# Patient Record
Sex: Female | Born: 1950 | Race: White | Hispanic: No | Marital: Single | Smoking: Former smoker
Health system: Southern US, Community
[De-identification: ages and names within clinical notes are randomized; demographics above are authoritative.]

## PROBLEM LIST (undated history)

## (undated) DIAGNOSIS — T7840XA Allergy, unspecified, initial encounter: Secondary | ICD-10-CM

## (undated) DIAGNOSIS — F329 Major depressive disorder, single episode, unspecified: Secondary | ICD-10-CM

## (undated) DIAGNOSIS — K5790 Diverticulosis of intestine, part unspecified, without perforation or abscess without bleeding: Secondary | ICD-10-CM

## (undated) DIAGNOSIS — M810 Age-related osteoporosis without current pathological fracture: Secondary | ICD-10-CM

## (undated) DIAGNOSIS — I10 Essential (primary) hypertension: Secondary | ICD-10-CM

## (undated) DIAGNOSIS — E785 Hyperlipidemia, unspecified: Secondary | ICD-10-CM

## (undated) DIAGNOSIS — M8580 Other specified disorders of bone density and structure, unspecified site: Secondary | ICD-10-CM

## (undated) DIAGNOSIS — I2510 Atherosclerotic heart disease of native coronary artery without angina pectoris: Principal | ICD-10-CM

## (undated) DIAGNOSIS — C4499 Other specified malignant neoplasm of skin, unspecified: Secondary | ICD-10-CM

## (undated) DIAGNOSIS — H3530 Unspecified macular degeneration: Secondary | ICD-10-CM

## (undated) DIAGNOSIS — F32A Depression, unspecified: Secondary | ICD-10-CM

## (undated) DIAGNOSIS — R0609 Other forms of dyspnea: Secondary | ICD-10-CM

## (undated) DIAGNOSIS — Z86010 Personal history of colonic polyps: Secondary | ICD-10-CM

## (undated) HISTORY — DX: Major depressive disorder, single episode, unspecified: F32.9

## (undated) HISTORY — DX: Other forms of dyspnea: R06.09

## (undated) HISTORY — DX: Other specified disorders of bone density and structure, unspecified site: M85.80

## (undated) HISTORY — DX: Personal history of colonic polyps: Z86.010

## (undated) HISTORY — PX: EYE SURGERY: SHX253

## (undated) HISTORY — DX: Age-related osteoporosis without current pathological fracture: M81.0

## (undated) HISTORY — DX: Allergy, unspecified, initial encounter: T78.40XA

## (undated) HISTORY — DX: Atherosclerotic heart disease of native coronary artery without angina pectoris: I25.10

## (undated) HISTORY — DX: Diverticulosis of intestine, part unspecified, without perforation or abscess without bleeding: K57.90

## (undated) HISTORY — DX: Unspecified macular degeneration: H35.30

## (undated) HISTORY — DX: Depression, unspecified: F32.A

## (undated) HISTORY — DX: Essential (primary) hypertension: I10

## (undated) HISTORY — DX: Hyperlipidemia, unspecified: E78.5

## (undated) HISTORY — DX: Other specified malignant neoplasm of skin, unspecified: C44.99

---

## 1999-05-05 ENCOUNTER — Other Ambulatory Visit: Admission: RE | Admit: 1999-05-05 | Discharge: 1999-05-05 | Payer: Self-pay | Admitting: Gastroenterology

## 2000-10-10 ENCOUNTER — Encounter: Admission: RE | Admit: 2000-10-10 | Discharge: 2000-10-10 | Payer: Self-pay | Admitting: *Deleted

## 2000-10-10 ENCOUNTER — Encounter: Payer: Self-pay | Admitting: *Deleted

## 2002-07-11 HISTORY — PX: OTHER SURGICAL HISTORY: SHX169

## 2003-09-19 ENCOUNTER — Encounter: Admission: RE | Admit: 2003-09-19 | Discharge: 2003-09-19 | Payer: Self-pay | Admitting: Family Medicine

## 2003-12-25 ENCOUNTER — Other Ambulatory Visit: Admission: RE | Admit: 2003-12-25 | Discharge: 2003-12-25 | Payer: Self-pay | Admitting: Family Medicine

## 2005-01-08 HISTORY — PX: ROTATOR CUFF REPAIR: SHX139

## 2007-06-22 ENCOUNTER — Inpatient Hospital Stay (HOSPITAL_COMMUNITY): Admission: EM | Admit: 2007-06-22 | Discharge: 2007-06-25 | Payer: Self-pay | Admitting: Emergency Medicine

## 2010-08-30 LAB — LIPID PANEL: HDL: 69 mg/dL (ref 35–70)

## 2010-11-09 HISTORY — PX: COLONOSCOPY W/ POLYPECTOMY: SHX1380

## 2010-11-22 LAB — BASIC METABOLIC PANEL
Creatinine: 0.8 mg/dL (ref 0.5–1.1)
Glucose: 83 mg/dL
Potassium: 4.5 mmol/L (ref 3.4–5.3)

## 2010-11-22 LAB — HEPATIC FUNCTION PANEL
ALT: 13 U/L (ref 7–35)
AST: 22 U/L (ref 13–35)

## 2010-11-22 LAB — CBC AND DIFFERENTIAL: Hemoglobin: 13.8 g/dL (ref 12.0–16.0)

## 2010-11-22 LAB — HM COLONOSCOPY: HM Colonoscopy: 2009

## 2010-11-22 LAB — HM MAMMOGRAPHY: HM Mammogram: 2005

## 2010-11-23 NOTE — H&P (Signed)
Candice Saunders, Candice Saunders             ACCOUNT NO.:  [id number]   MEDICAL RECORD NO.:  [id number]          PATIENT TYPE:  INP   LOCATION:  1830                         FACILITY:  [facility]   PHYSICIAN:  Shirley Friar, MDDATE OF BIRTH:  1950-09-13   DATE OF ADMISSION:  06/22/2007  DATE OF DISCHARGE:                              HISTORY & PHYSICAL   We are admitting Ms. Griffing today with the admission diagnosis of lower  GI bleed to the gastroenterology service, to attending, Dr. Bernette Redbird.   HISTORY OF PRESENT ILLNESS:  This is a 59 year old female who underwent  colonoscopy on June 20, 2007.  She had three polyps removed, one  subcentimeter polyp in the rectum, one 3 mm polyp in the ascending colon  and one 18 mm polyp in the proximal ascending colon right near the  ileocecal valve.  The 1.8 cm polyp was removed with snare cautery in  piecemeal fashion, while the smaller polyps were removed without  cautery.  After her procedure she felt fine until this afternoon at  approximately 1:00 p.m. when she says she began to feel faint and had  cramps as though she was going to have diarrhea.  When she had her bowel  movement, there was a large amount of bright red blood with some stool  mixed in.  The patient reports that she felt lightheaded and felt like  passing out, however, now she feels better.  She denies shortness of  breath, palpitations, any recent fever or other illness.   PRIMARY CARE PHYSICIAN:  Dr. Nadyne Coombes.   ALLERGIES:  She denies any medical allergies.   MEDICATIONS:  Her only medication is Fosamax , which she started one  week ago and has only taken one dose.   PAST MEDICAL HISTORY:  Significant for  1. Rectal polyps in October 2000 which was removed by her GI      physician, Dr. Bernette Redbird.  2. Osteoporosis.  3. She had a rotator cuff repair.  4. She has a history of high cholesterol which she manages with diet.   SOCIAL HISTORY:  Positive for  smoking a pack a day, drinking alcohol,  two beers a day.  She lives at home with her partner.   FAMILY HISTORY:  Significant for no colon cancer or polyps.   REVIEW OF SYSTEMS:  As per HPI.   PHYSICAL EXAMINATION:  GENERAL APPEARANCE:  She is alert and oriented in  no apparent distress.  VITAL SIGNS:  Her pulse is currently 78.  Her blood pressure is 128/76  and she is afebrile.  HEART:  Regular rate and rhythm.  LUNGS: Clear to auscultation.  ABDOMEN:  Soft, nontender, nondistended with active bowel sounds.  EXTREMITIES:  She has no edema in her lower extremities.  Her color  looks good.  She actually looks pretty well.   LABORATORY DATA:  Her labs are pending.   ASSESSMENT:  Dr. Charlott Rakes is admitting the patient for lower  gastrointestinal bleed, likely secondary to her polypectomy done on  Wednesday.   PLAN:  Will admit for close monitoring and rehydration, no  immediate  plans for colonoscopy; rather, we will watch the patient closely  overnight and give her MiraLax to help clean out her GI tract.      Stephani Police, [place]      Shirley Friar, MD  Electronically Signed    MLY/MEDQ  D:  06/22/2007  T:  06/24/2007  Job:  [id number]   cc:   Clydie Braun L. Hal Hope, M.D.

## 2010-11-23 NOTE — Discharge Summary (Signed)
Candice Saunders, Candice Saunders             ACCOUNT NO.:  [id number]   MEDICAL RECORD NO.:  [id number]          PATIENT TYPE:  INP   LOCATION:  5528                         FACILITY:  [facility]   PHYSICIAN:  Shirley Friar, MDDATE OF BIRTH:  02/05/1951   DATE OF ADMISSION:  06/22/2007  DATE OF DISCHARGE:  06/25/2007                               DISCHARGE SUMMARY   DISCHARGE DIAGNOSES:  1. Post-polypectomy bleed.  2. Colon polyps removed on June 20, 2007.   HOSPITAL COURSE:  Candice Saunders is a 60 year old pleasant white female who  came in 2 days after having a colonoscopy,  which she had 3 polyps  removed.  The largest polyp was 1.8 cm, and was removed in the proximal  ascending colon in the piecemeal fashion with snare cautery.  The other  2  polyps were less than 5 mm in size; one was removed with cold forceps  and the other was removed with cold snare.  She also had some diffuse  diverticulosis noted in the sigmoid colon.   On admission her labs revealed a hemoglobin normal at 13.1.  She dropped  down to 10.5 during her hospitalization, but did not require any blood  products.  Her bleeding initially was bright red blood at home, and  decreased in amount and brightness during her hospitalization.  On  June 24, 2007 she had a small amount of dark red colored blood  without any stool.   The plan as of now is to watch her overnight, and if everything  continues to be stable and bleeding continues to subside, then we will  plan for discharge June 25, 2007.   DISCHARGE DIET:  High fiber.   DISCHARGE ACTIVITY:  Increase activity slowly.   DISCHARGE FOLLOWUP:  1. Follow with Dr. Matthias Hughs this week for lab check and office visit.  2. Follow-up appointment with Dr. Hal Hope in 2-3 weeks.   DISCHARGE INSTRUCTIONS:  Avoid ibuprofen products for 14 days.      Shirley Friar, MD  Electronically Signed     VCS/MEDQ  D:  06/24/2007  T:  06/25/2007  Job:  [id number]   cc:    Bernette Redbird, M.D.  Marcos Eke. Hal Hope, M.D.

## 2011-04-15 LAB — CBC
HCT: 31.5 — ABNORMAL LOW
Hemoglobin: 10.9 — ABNORMAL LOW
MCHC: 34.6
MCV: 95.5
Platelets: 303
RBC: 3.3 — ABNORMAL LOW
RDW: 12.5
WBC: 4.8

## 2011-04-18 LAB — CBC
HCT: 29.8 — ABNORMAL LOW
HCT: 30.7 — ABNORMAL LOW
HCT: 38.3
Hemoglobin: 10.5 — ABNORMAL LOW
Hemoglobin: 10.5 — ABNORMAL LOW
Hemoglobin: 10.5 — ABNORMAL LOW
Hemoglobin: 13.1
MCHC: 34.1
MCHC: 34.2
MCHC: 34.2
MCHC: 35.3
MCV: 94.1
MCV: 95.5
MCV: 95.8
MCV: 96.4
Platelets: 252
Platelets: 258
Platelets: 269
Platelets: 313
RBC: 3.16 — ABNORMAL LOW
RBC: 3.17 — ABNORMAL LOW
RBC: 3.18 — ABNORMAL LOW
RBC: 4.02
RDW: 12.3
RDW: 12.4
RDW: 12.5
RDW: 12.6
WBC: 4.7
WBC: 5.5
WBC: 8.6

## 2011-04-18 LAB — COMPREHENSIVE METABOLIC PANEL
ALT: 16
AST: 22
Albumin: 4.1
Alkaline Phosphatase: 67
BUN: 9
CO2: 27
Calcium: 9.1
Chloride: 100
Creatinine, Ser: 0.86
GFR calc Af Amer: >60
GFR calc non Af Amer: >60
Glucose, Bld: 115 — ABNORMAL HIGH
Potassium: 3.7
Sodium: 134 — ABNORMAL LOW
Total Bilirubin: 0.7
Total Protein: 6.8

## 2011-09-13 ENCOUNTER — Other Ambulatory Visit: Payer: Self-pay | Admitting: Family Medicine

## 2011-09-13 MED ORDER — CITALOPRAM HYDROBROMIDE 20 MG PO TABS: 20.0000 mg | ORAL_TABLET | Freq: Every day | ORAL | Status: DC

## 2011-09-29 ENCOUNTER — Encounter: Payer: Self-pay | Admitting: Family Medicine

## 2011-09-29 DIAGNOSIS — M8580 Other specified disorders of bone density and structure, unspecified site: Secondary | ICD-10-CM

## 2011-09-29 DIAGNOSIS — Z860100 Personal history of colon polyps, unspecified: Secondary | ICD-10-CM

## 2011-09-29 DIAGNOSIS — J302 Other seasonal allergic rhinitis: Secondary | ICD-10-CM

## 2011-09-29 DIAGNOSIS — F32A Depression, unspecified: Secondary | ICD-10-CM

## 2011-09-29 DIAGNOSIS — F329 Major depressive disorder, single episode, unspecified: Secondary | ICD-10-CM

## 2011-09-29 DIAGNOSIS — E785 Hyperlipidemia, unspecified: Secondary | ICD-10-CM

## 2011-09-29 DIAGNOSIS — M810 Age-related osteoporosis without current pathological fracture: Secondary | ICD-10-CM | POA: Insufficient documentation

## 2011-09-29 HISTORY — DX: Other specified disorders of bone density and structure, unspecified site: M85.80

## 2011-09-29 HISTORY — DX: Personal history of colonic polyps: Z86.010

## 2011-09-29 HISTORY — DX: Personal history of colon polyps, unspecified: Z86.0100

## 2011-10-03 ENCOUNTER — Encounter: Payer: Self-pay | Admitting: Family Medicine

## 2011-10-03 ENCOUNTER — Ambulatory Visit (INDEPENDENT_AMBULATORY_CARE_PROVIDER_SITE_OTHER): Payer: BC Managed Care – PPO | Admitting: Family Medicine

## 2011-10-03 VITALS — BP 120/66 | HR 68 | Temp 96.7°F | Resp 16 | Ht 62.5 in | Wt 146.8 lb

## 2011-10-03 DIAGNOSIS — F329 Major depressive disorder, single episode, unspecified: Secondary | ICD-10-CM

## 2011-10-03 DIAGNOSIS — Z0000 Encounter for general adult medical examination without abnormal findings: Secondary | ICD-10-CM

## 2011-10-03 DIAGNOSIS — F32A Depression, unspecified: Secondary | ICD-10-CM

## 2011-10-03 DIAGNOSIS — M8580 Other specified disorders of bone density and structure, unspecified site: Secondary | ICD-10-CM

## 2011-10-03 DIAGNOSIS — Z9109 Other allergy status, other than to drugs and biological substances: Secondary | ICD-10-CM

## 2011-10-03 LAB — CBC WITH DIFFERENTIAL/PLATELET
Basophils Absolute: 0 10*3/uL (ref 0.0–0.1)
Basophils Relative: 0 % (ref 0–1)
Eosinophils Absolute: 0.1 10*3/uL (ref 0.0–0.7)
Eosinophils Relative: 1 % (ref 0–5)
Lymphocytes Relative: 20 % (ref 12–46)
MCV: 96.9 fL (ref 78.0–100.0)
Platelets: 317 10*3/uL (ref 150–400)
RDW: 12.6 % (ref 11.5–15.5)
WBC: 8.3 10*3/uL (ref 4.0–10.5)

## 2011-10-03 LAB — COMPREHENSIVE METABOLIC PANEL
ALT: 18 U/L (ref 0–35)
AST: 25 U/L (ref 0–37)
Alkaline Phosphatase: 63 U/L (ref 39–117)
Creat: 0.75 mg/dL (ref 0.50–1.10)
Total Bilirubin: 0.6 mg/dL (ref 0.3–1.2)

## 2011-10-03 LAB — LIPID PANEL
Total CHOL/HDL Ratio: 2.4 Ratio
VLDL: 9 mg/dL (ref 0–40)

## 2011-10-03 LAB — TSH: TSH: 0.913 u[IU]/mL (ref 0.350–4.500)

## 2011-10-03 MED ORDER — CITALOPRAM HYDROBROMIDE 20 MG PO TABS: 10.0000 mg | ORAL_TABLET | Freq: Every day | ORAL | Status: DC

## 2011-10-03 MED ORDER — DICLOFENAC SODIUM 75 MG PO TBEC: 75.0000 mg | DELAYED_RELEASE_TABLET | Freq: Two times a day (BID) | ORAL | Status: DC | PRN

## 2011-10-03 MED ORDER — ALENDRONATE SODIUM 70 MG PO TABS: 70.0000 mg | ORAL_TABLET | ORAL | Status: DC

## 2011-10-03 MED ORDER — FLUTICASONE PROPIONATE 50 MCG/ACT NA SUSP: 2.0000 | NASAL | Status: DC | PRN

## 2011-10-03 NOTE — Progress Notes (Signed)
  Subjective:    Patient ID: Candice Saunders, female    DOB: 1951/04/18, 61 y.o.   MRN: [id number]  HPI Presents for CPE  Recent herpes Zoster in October.  Rash lasted 6 weeks however pain resolved in less than a week. Has received in the past Zostervax.  Arthralgias interested in NSAID refill; Denies GI side effects  Depression- symptoms in remission interested in weaning from Celexa.  Weight loss- exercising/yard work; [employer]  Allergies with occasional ETD Review of Systems     Objective:   Physical Exam  Constitutional: She is oriented to person, place, and time. She appears well-developed.  HENT:  Head: Normocephalic.  Right Ear: External ear normal. Tympanic membrane is retracted.  Left Ear: External ear normal. Tympanic membrane is retracted.  Nose: Mucosal edema present.  Mouth/Throat: Oropharynx is clear and moist.  Eyes: Conjunctivae and EOM are normal. Pupils are equal, round, and reactive to light.  Neck: Normal range of motion. Neck supple. No thyromegaly present.  Cardiovascular: Normal rate, regular rhythm and normal heart sounds.   Pulmonary/Chest: Effort normal and breath sounds normal. Right breast exhibits no mass, no nipple discharge and no skin change. Left breast exhibits no mass, no nipple discharge and no skin change.  Abdominal: Soft. Bowel sounds are normal. There is no hepatosplenomegaly. There is no tenderness.  Musculoskeletal: Normal range of motion.  Neurological: She is alert and oriented to person, place, and time. She has normal reflexes. She exhibits normal muscle tone. Coordination normal.  Skin:       Multiple lentigo  Psychiatric: She has a normal mood and affect.       Assessment & Plan:   1. Routine general medical examination at a health care facility  Comprehensive metabolic panel, Lipid panel  2. Osteopenia  diclofenac (VOLTAREN) 75 MG EC tablet, alendronate (FOSAMAX) 70 MG tablet  3. Depression  citalopram (CELEXA) 20  MG tablet, CBC with Differential, TSH  4. Environmental allergies  fluticasone (FLONASE) 50 MCG/ACT nasal spray   Patient given instructions to wean from Celexa over the next 4 weeks. Anticipatory guidance

## 2011-10-13 ENCOUNTER — Other Ambulatory Visit: Payer: Self-pay | Admitting: Family Medicine

## 2011-10-13 DIAGNOSIS — E871 Hypo-osmolality and hyponatremia: Secondary | ICD-10-CM

## 2011-10-27 ENCOUNTER — Other Ambulatory Visit (INDEPENDENT_AMBULATORY_CARE_PROVIDER_SITE_OTHER): Payer: BC Managed Care – PPO | Admitting: *Deleted

## 2011-10-27 DIAGNOSIS — E871 Hypo-osmolality and hyponatremia: Secondary | ICD-10-CM

## 2011-10-27 LAB — BASIC METABOLIC PANEL
Calcium: 9 mg/dL (ref 8.4–10.5)
Creat: 0.71 mg/dL (ref 0.50–1.10)
Sodium: 132 mEq/L — ABNORMAL LOW (ref 135–145)

## 2011-11-23 ENCOUNTER — Telehealth: Payer: Self-pay | Admitting: Family Medicine

## 2011-11-23 ENCOUNTER — Other Ambulatory Visit: Payer: Self-pay | Admitting: Family Medicine

## 2011-11-23 DIAGNOSIS — E871 Hypo-osmolality and hyponatremia: Secondary | ICD-10-CM

## 2011-11-23 NOTE — Telephone Encounter (Signed)
LM on VM.  Would like to recheck CMET in 2-3 weeks.

## 2011-12-02 ENCOUNTER — Telehealth: Payer: Self-pay

## 2011-12-02 MED ORDER — CITALOPRAM HYDROBROMIDE 10 MG PO TABS: 10.0000 mg | ORAL_TABLET | Freq: Every day | ORAL | Status: DC

## 2011-12-02 NOTE — Telephone Encounter (Signed)
Pharmacy called and said that pt reports that her doctor has increased her citalopram to 20 mg one tablet QD instead of 0.5 tab QD. If so, they need a new Rx sent in to them. Dr Hal Hope, did you want this increase?

## 2011-12-02 NOTE — Telephone Encounter (Signed)
If patient was inadvertently taking Celexa 20 mg daily may continue if her mood symptoms improved on that dose(can refill for 1 year) otherwise we can refill 1 year's worth of Celexa at prior dose.  Please let patient know her sodium is slightly low and can occur with either Voltaren or Celexa.  No need to change medications at this time or repeat labs.

## 2011-12-02 NOTE — Telephone Encounter (Signed)
Spoke w/pt and she said she had mistakenly been taking a whole tablet of the 20 mg. She stated that she would rather go back on the 10 mg and take a whole tablet instead of breaking the 20 mg in half. Sending in new Rx per Dr Lenord Fellers note to pharmacy. Called pharm to notify of change and that pt is on her way to pharm.

## 2011-12-13 ENCOUNTER — Telehealth: Payer: Self-pay

## 2011-12-13 NOTE — Telephone Encounter (Signed)
Pt would like lab results taken in April by Dr Hal Hope.  Please call her back.

## 2011-12-14 NOTE — Telephone Encounter (Signed)
Called pt and she had already gotten results from 4/18. She stated she did come back in after Dr Richter's message left on 5/15 to repeat test and would like results of that test. Told pt that I do not have those results, it is still showing as a future order, but that I will check w/the Lab and have them check on it.

## 2011-12-14 NOTE — Telephone Encounter (Signed)
Pt notified of April labs. Did not know she was supposed to come back for another recheck.. Pt can not afford to and does not want to til Dr. Katrinka Blazing comes in Aug. Since Dr. Hal Hope is leaving.

## 2012-10-09 ENCOUNTER — Encounter (INDEPENDENT_AMBULATORY_CARE_PROVIDER_SITE_OTHER): Payer: BC Managed Care – PPO | Admitting: Ophthalmology

## 2012-10-09 DIAGNOSIS — H3530 Unspecified macular degeneration: Secondary | ICD-10-CM

## 2012-10-09 DIAGNOSIS — H35379 Puckering of macula, unspecified eye: Secondary | ICD-10-CM

## 2012-10-09 DIAGNOSIS — H43819 Vitreous degeneration, unspecified eye: Secondary | ICD-10-CM

## 2012-10-09 HISTORY — DX: Unspecified macular degeneration: H35.30

## 2012-10-15 ENCOUNTER — Encounter: Payer: Self-pay | Admitting: Family Medicine

## 2012-10-15 ENCOUNTER — Ambulatory Visit (INDEPENDENT_AMBULATORY_CARE_PROVIDER_SITE_OTHER): Payer: BC Managed Care – PPO | Admitting: Family Medicine

## 2012-10-15 VITALS — BP 130/82 | HR 61 | Temp 98.2°F | Resp 16 | Ht 63.0 in | Wt 153.6 lb

## 2012-10-15 DIAGNOSIS — Z0000 Encounter for general adult medical examination without abnormal findings: Secondary | ICD-10-CM

## 2012-10-15 DIAGNOSIS — F329 Major depressive disorder, single episode, unspecified: Secondary | ICD-10-CM

## 2012-10-15 DIAGNOSIS — M8580 Other specified disorders of bone density and structure, unspecified site: Secondary | ICD-10-CM

## 2012-10-15 DIAGNOSIS — Z86010 Personal history of colonic polyps: Secondary | ICD-10-CM

## 2012-10-15 DIAGNOSIS — F32A Depression, unspecified: Secondary | ICD-10-CM

## 2012-10-15 DIAGNOSIS — Z23 Encounter for immunization: Secondary | ICD-10-CM

## 2012-10-15 DIAGNOSIS — Z01419 Encounter for gynecological examination (general) (routine) without abnormal findings: Secondary | ICD-10-CM | POA: Insufficient documentation

## 2012-10-15 LAB — POCT URINALYSIS DIPSTICK
Blood, UA: NEGATIVE
Ketones, UA: NEGATIVE
Protein, UA: NEGATIVE
Spec Grav, UA: 1.015
Urobilinogen, UA: 0.2
pH, UA: 8.5

## 2012-10-15 LAB — CBC WITH DIFFERENTIAL/PLATELET
Basophils Absolute: 0 10*3/uL (ref 0.0–0.1)
Eosinophils Relative: 1 % (ref 0–5)
HCT: 41.2 % (ref 36.0–46.0)
Lymphocytes Relative: 27 % (ref 12–46)
Lymphs Abs: 1.4 10*3/uL (ref 0.7–4.0)
MCV: 92.4 fL (ref 78.0–100.0)
Neutro Abs: 3.2 10*3/uL (ref 1.7–7.7)
Platelets: 307 10*3/uL (ref 150–400)
RBC: 4.46 MIL/uL (ref 3.87–5.11)
WBC: 5.2 10*3/uL (ref 4.0–10.5)

## 2012-10-15 LAB — COMPREHENSIVE METABOLIC PANEL
ALT: 15 U/L (ref 0–35)
AST: 21 U/L (ref 0–37)
Albumin: 4.5 g/dL (ref 3.5–5.2)
CO2: 27 mEq/L (ref 19–32)
Calcium: 9.2 mg/dL (ref 8.4–10.5)
Chloride: 96 mEq/L (ref 96–112)
Creat: 0.81 mg/dL (ref 0.50–1.10)
Potassium: 4 mEq/L (ref 3.5–5.3)
Total Protein: 6.8 g/dL (ref 6.0–8.3)

## 2012-10-15 LAB — FOLATE: Folate: >20 ng/mL

## 2012-10-15 LAB — HEMOGLOBIN A1C: Hgb A1c MFr Bld: 5.6 % (ref <5.7)

## 2012-10-15 LAB — LIPID PANEL
Cholesterol: 277 mg/dL — ABNORMAL HIGH (ref 0–200)
VLDL: 11 mg/dL (ref 0–40)

## 2012-10-15 LAB — TSH: TSH: 1.592 u[IU]/mL (ref 0.350–4.500)

## 2012-10-15 LAB — VITAMIN B12: Vitamin B-12: 644 pg/mL (ref 211–911)

## 2012-10-15 MED ORDER — CITALOPRAM HYDROBROMIDE 10 MG PO TABS: 10.0000 mg | ORAL_TABLET | Freq: Every day | ORAL | Status: DC

## 2012-10-15 NOTE — Patient Instructions (Addendum)
Routine general medical examination at a health care facility - Plan: Vitamin B12, Vitamin D 25 hydroxy, TSH, Lipid panel, Folate, CBC with Differential, Comprehensive metabolic panel, Hemoglobin A1c, POCT urinalysis dipstick, CANCELED: CK  Routine gynecological examination - Plan: MM Digital Screening  Depression - Plan: citalopram (CELEXA) 10 MG tablet    RECOMMEND 3 SERVINGS OF DAIRY DAILY OR CALCIUM 1200MG  DAILY WITH VITAMIN D 800 UNITS DAILY. RETURN IN ONE YEAR FOR COMPLETE PHYSICAL EXAM. WE WILL SCHEDULE YOUR MAMMOGRAM.

## 2012-10-15 NOTE — Progress Notes (Signed)
[address]   [place], [place]  [address]   [phone]  Subjective:    Patient ID: Candice Saunders, female    DOB: 09-21-1950, 62 y.o.   MRN: [id number]  HPI This 62 y.o. female presents for evaluation for CPE.  Last physical 10/03/11 by Dr. Hal Hope. Colonoscopy 11/22/10. Precancerous colon polyp with GI bleeding Buccini.   Colonoscopies every two to three years.   Pap smear several years 2008 WNL. Mammogram 2013 at [facility]. Bone density scan 06/2011 [facility]. Tetanus 2004. Influenza vaccine yearly. Zostavax 10/2010. Pneumovax never. Dental exam UTD; every six months. Eye exam 2014.  Depression: stable; decided to continue Celexa 10mg  daily after last CPE.  Broke up with partner two years ago; has been living together for past two years; now deciding to move into town[place].  Insomnia three days per week.   Osteopenia:  Maintained on Fosamax for years; bone density UTD.  Agreeable to trial off of medication.   Review of Systems  Constitutional: Negative for fever, chills, diaphoresis, activity change, appetite change, fatigue and unexpected weight change.  HENT: Negative for hearing loss, ear pain, nosebleeds, congestion, sore throat, facial swelling, rhinorrhea, sneezing, drooling, mouth sores, trouble swallowing, neck pain, neck stiffness, dental problem, voice change, postnasal drip, sinus pressure, tinnitus and ear discharge.   Eyes: Negative for photophobia, pain, discharge, redness, itching and visual disturbance.  Respiratory: Negative for apnea, cough, choking, chest tightness, shortness of breath, wheezing and stridor.   Cardiovascular: Negative for chest pain, palpitations and leg swelling.  Gastrointestinal: Negative for nausea, vomiting, abdominal pain, diarrhea, constipation, blood in stool, abdominal distention, anal bleeding and rectal pain.  Endocrine: Negative for cold intolerance, heat intolerance,  polydipsia, polyphagia and polyuria.  Genitourinary: Negative for dysuria, urgency, frequency, hematuria, flank pain, vaginal bleeding, vaginal discharge, enuresis, difficulty urinating, genital sores, vaginal pain, pelvic pain and dyspareunia.  Musculoskeletal: Negative for myalgias, back pain, joint swelling, arthralgias and gait problem.  Skin: Negative for color change, pallor, rash and wound.  Allergic/Immunologic: Negative for environmental allergies, food allergies and immunocompromised state.  Neurological: Negative for dizziness, tremors, seizures, syncope, facial asymmetry, speech difficulty, weakness, light-headedness, numbness and headaches.  Hematological: Negative for adenopathy. Does not bruise/bleed easily.  Psychiatric/Behavioral: Positive for sleep disturbance. Negative for suicidal ideas, hallucinations, behavioral problems, confusion, self-injury, dysphoric mood, decreased concentration and agitation. The patient is not nervous/anxious and is not hyperactive.         Past Medical History  Diagnosis Date  . Osteopenia 09/29/2011  . Hx of colonic polyps 09/29/2011  . Allergy   . Depression   . Diverticulosis   . Hyperlipidemia   . Macular degeneration 10/09/2012    [facility]/Retinal specialist    Past Surgical History  Procedure Laterality Date  . Rotator cuff repair  01/08/2005    Right  . Eye surgery      Cataract resection  . Colonoscopy w/ polypectomy  11/09/2010    Buccini; every 2-3 years  . Admission  07/11/2002    GI bleeding due to polyp.      Prior to Admission medications   Medication Sig Start Date End Date Taking? Authorizing Provider  alendronate (FOSAMAX) 70 MG tablet Take 1 tablet (70 mg total) by mouth every 7 (seven) days. Take with a full glass of water on an empty stomach. 10/03/11  Yes Dois Davenport, MD  citalopram (CELEXA) 10 MG tablet Take 1 tablet (10 mg total)  by mouth daily. 10/15/12 10/15/13 Yes Ethelda Chick, MD  diclofenac (VOLTAREN) 75  MG EC tablet Take 1 tablet (75 mg total) by mouth 2 (two) times daily as needed. 10/03/11  Yes Dois Davenport, MD  fluticasone (FLONASE) 50 MCG/ACT nasal spray Place 2 sprays into the nose as needed. 10/03/11   Dois Davenport, MD    No Known Allergies  History   Social History  . Marital Status: Single    Spouse Name: N/A    Number of Children: 0  . Years of Education: N/A   Occupational History  . retired     Social research officer, government; retired in 2004.   Social History Main Topics  . Smoking status: Former Smoker -- 30 years    Types: Cigarettes    Quit date: 08/26/2006  . Smokeless tobacco: Never Used  . Alcohol Use: 10.8 oz/week    18 Cans of beer per week     Comment: drinks 2 beers daily; 4 beers on Friday and Saturdays.  . Drug Use: No  . Sexually Active: Not Currently -- Female partner(s)     Comment: Same sex sexual partners   Other Topics Concern  . Not on file   Social History Narrative   Marital status: single; dating seriously.  Dates same sexual partners.      Children: none     Lives: with ex-partner since 2012; moving out in 10/2012.      Employment: retired since 2004; school psychologist x 30 years.        Tobacco: quit smoking 2004.       Alcohol:  2 beers per day; more on weekends (4 beers per weekend night).      Drugs: none      Seatbelt: 100%      Sunscreen:  Sporadic.      Guns: none.      Sexual activity: same sex partners.                Family History  Problem Relation Age of Onset  . Alzheimer's disease Mother   . Cancer Mother 57    Breast cancer  . Osteoporosis Mother   . Stroke Mother   . Heart disease Father 49    AMI, CHF  . Kidney disease Father 38    ESRD/HD  . Stroke Brother     Objective:   Physical Exam  Nursing note and vitals reviewed. Constitutional: She is oriented to person, place, and time. She appears well-developed and well-nourished. No distress.  HENT:  Head: Normocephalic and atraumatic.  Right Ear:  External ear normal.  Left Ear: External ear normal.  Nose: Nose normal.  Mouth/Throat: Oropharynx is clear and moist.  Eyes: Conjunctivae and EOM are normal. Pupils are equal, round, and reactive to light.  Neck: Normal range of motion. Neck supple. No JVD present. No thyromegaly present.  Cardiovascular: Normal rate, regular rhythm, normal heart sounds and intact distal pulses.  Exam reveals no gallop and no friction rub.   No murmur heard. Pulmonary/Chest: Effort normal and breath sounds normal. She has no wheezes. She has no rales.  Abdominal: Soft. Bowel sounds are normal. She exhibits no distension and no mass. There is no tenderness. There is no rebound and no guarding. Hernia confirmed negative in the right inguinal area and confirmed negative in the left inguinal area.  Genitourinary: Vagina normal and uterus normal. No breast swelling, tenderness, discharge or bleeding. There is no rash, tenderness or lesion on the  right labia. There is no rash, tenderness or lesion on the left labia. Cervix exhibits no motion tenderness. Right adnexum displays no mass, no tenderness and no fullness. Left adnexum displays no mass, no tenderness and no fullness.  Musculoskeletal:       Right shoulder: Normal.       Left shoulder: Normal.       Cervical back: Normal.       Thoracic back: Normal.  Lymphadenopathy:    She has no cervical adenopathy.       Right: No inguinal adenopathy present.       Left: No inguinal adenopathy present.  Neurological: She is alert and oriented to person, place, and time. She has normal reflexes. No cranial nerve deficit. She exhibits normal muscle tone. Coordination normal.  Skin: Skin is warm and dry. No rash noted. She is not diaphoretic. No erythema.  Psychiatric: She has a normal mood and affect. Her behavior is normal. Judgment and thought content normal.    EKG:  Sinus bradycardia; rate 59.  TDAP ADMINISTERED BY WANDA.  Results for orders placed in visit on  10/15/12  POCT URINALYSIS DIPSTICK      Result Value Range   Color, UA yellow     Clarity, UA clear     Glucose, UA neg     Bilirubin, UA neg     Ketones, UA neg     Spec Grav, UA 1.015     Blood, UA neg     pH, UA 8.5     Protein, UA neg     Urobilinogen, UA 0.2     Nitrite, UA neg     Leukocytes, UA Negative         Assessment & Plan:  Routine general medical examination at a health care facility - Plan: Vitamin B12, Vitamin D 25 hydroxy, TSH, Lipid panel, Folate, CBC with Differential, Comprehensive metabolic panel, Hemoglobin A1c, POCT urinalysis dipstick, Tdap vaccine greater than or equal to 7yo IM, EKG 12-Lead, CANCELED: CK  Routine gynecological examination - Plan: MM Digital Screening, Pap IG and HPV (high risk) DNA detection  Depression - Plan: citalopram (CELEXA) 10 MG tablet   Meds ordered this encounter  Medications  . citalopram (CELEXA) 10 MG tablet    Sig: Take 1 tablet (10 mg total) by mouth daily.    Dispense:  90 tablet    Refill:  3

## 2012-10-15 NOTE — Assessment & Plan Note (Signed)
Stable; discussed Fosamax therapy at length; to d/c therapy due to prolonged Fosamax use.  Recommend 3 servings of dairy daily or 1200mg  calcium daily; also recommend daily exercise.

## 2012-10-15 NOTE — Assessment & Plan Note (Signed)
Stable; refill of Citalopram provided.  No major issues at this time and emotionally stable.

## 2012-10-15 NOTE — Assessment & Plan Note (Signed)
Completed; pap smear obtained; refer for mammogram.  Bone density to be scheduled at next CPE.  Menopausal.

## 2012-10-15 NOTE — Assessment & Plan Note (Signed)
Stable; colonoscopy UTD in 11/2010.

## 2012-10-15 NOTE — Assessment & Plan Note (Signed)
Anticipatory guidance --- three servings of dairy daily; exercise daily.  Pap smear obtained; refer for mammogram. Will schedule bone density next year. Colonoscopy UTD.   S/p TDAP; other immunizations UTD.  Obtain labs.

## 2012-10-17 LAB — PAP IG AND HPV HIGH-RISK

## 2012-10-26 ENCOUNTER — Encounter: Payer: Self-pay | Admitting: Family Medicine

## 2012-11-13 ENCOUNTER — Encounter: Payer: Self-pay | Admitting: Family Medicine

## 2012-12-27 ENCOUNTER — Telehealth: Payer: Self-pay | Admitting: Radiology

## 2012-12-27 NOTE — Telephone Encounter (Signed)
Patient advised mammogram normal

## 2013-01-03 ENCOUNTER — Encounter: Payer: Self-pay | Admitting: Family Medicine

## 2013-07-22 ENCOUNTER — Ambulatory Visit (INDEPENDENT_AMBULATORY_CARE_PROVIDER_SITE_OTHER): Payer: BC Managed Care – PPO | Admitting: Ophthalmology

## 2013-08-05 ENCOUNTER — Ambulatory Visit (INDEPENDENT_AMBULATORY_CARE_PROVIDER_SITE_OTHER): Payer: BC Managed Care – PPO | Admitting: Ophthalmology

## 2013-08-05 DIAGNOSIS — H43819 Vitreous degeneration, unspecified eye: Secondary | ICD-10-CM

## 2013-08-05 DIAGNOSIS — H35379 Puckering of macula, unspecified eye: Secondary | ICD-10-CM

## 2013-08-05 DIAGNOSIS — H3530 Unspecified macular degeneration: Secondary | ICD-10-CM

## 2013-11-23 ENCOUNTER — Other Ambulatory Visit: Payer: Self-pay | Admitting: Family Medicine

## 2013-12-09 DIAGNOSIS — C4499 Other specified malignant neoplasm of skin, unspecified: Secondary | ICD-10-CM

## 2013-12-09 HISTORY — DX: Other specified malignant neoplasm of skin, unspecified: C44.99

## 2013-12-11 ENCOUNTER — Encounter: Payer: Self-pay | Admitting: Family Medicine

## 2013-12-11 ENCOUNTER — Ambulatory Visit (INDEPENDENT_AMBULATORY_CARE_PROVIDER_SITE_OTHER): Payer: BC Managed Care – PPO | Admitting: Family Medicine

## 2013-12-11 VITALS — BP 140/80 | HR 71 | Temp 98.2°F | Resp 16 | Ht 62.75 in | Wt 151.8 lb

## 2013-12-11 DIAGNOSIS — N649 Disorder of breast, unspecified: Secondary | ICD-10-CM

## 2013-12-11 DIAGNOSIS — F32A Depression, unspecified: Secondary | ICD-10-CM

## 2013-12-11 DIAGNOSIS — Z0000 Encounter for general adult medical examination without abnormal findings: Secondary | ICD-10-CM

## 2013-12-11 DIAGNOSIS — E785 Hyperlipidemia, unspecified: Secondary | ICD-10-CM

## 2013-12-11 DIAGNOSIS — J302 Other seasonal allergic rhinitis: Secondary | ICD-10-CM

## 2013-12-11 DIAGNOSIS — L988 Other specified disorders of the skin and subcutaneous tissue: Secondary | ICD-10-CM

## 2013-12-11 DIAGNOSIS — B3789 Other sites of candidiasis: Secondary | ICD-10-CM

## 2013-12-11 DIAGNOSIS — Z86010 Personal history of colonic polyps: Secondary | ICD-10-CM

## 2013-12-11 DIAGNOSIS — M8580 Other specified disorders of bone density and structure, unspecified site: Secondary | ICD-10-CM

## 2013-12-11 DIAGNOSIS — F329 Major depressive disorder, single episode, unspecified: Secondary | ICD-10-CM

## 2013-12-11 LAB — POCT UA - MICROSCOPIC ONLY
Casts, Ur, LPF, POC: NEGATIVE
Crystals, Ur, HPF, POC: NEGATIVE
Mucus, UA: NEGATIVE
Yeast, UA: NEGATIVE

## 2013-12-11 LAB — CBC WITH DIFFERENTIAL/PLATELET
BASOS PCT: 1 % (ref 0–1)
Basophils Absolute: 0.1 10*3/uL (ref 0.0–0.1)
EOS ABS: 0.1 10*3/uL (ref 0.0–0.7)
Eosinophils Relative: 2 % (ref 0–5)
HCT: 39.3 % (ref 36.0–46.0)
Hemoglobin: 13.6 g/dL (ref 12.0–15.0)
Lymphocytes Relative: 28 % (ref 12–46)
Lymphs Abs: 1.7 10*3/uL (ref 0.7–4.0)
MCH: 31.4 pg (ref 26.0–34.0)
MCHC: 34.6 g/dL (ref 30.0–36.0)
MCV: 90.8 fL (ref 78.0–100.0)
MONO ABS: 0.5 10*3/uL (ref 0.1–1.0)
MONOS PCT: 9 % (ref 3–12)
NEUTROS PCT: 60 % (ref 43–77)
Neutro Abs: 3.5 10*3/uL (ref 1.7–7.7)
Platelets: 321 10*3/uL (ref 150–400)
RBC: 4.33 MIL/uL (ref 3.87–5.11)
RDW: 13.7 % (ref 11.5–15.5)
WBC: 5.9 10*3/uL (ref 4.0–10.5)

## 2013-12-11 LAB — HEMOGLOBIN A1C
HEMOGLOBIN A1C: 5.4 % (ref <5.7)
Mean Plasma Glucose: 108 mg/dL (ref <117)

## 2013-12-11 LAB — COMPLETE METABOLIC PANEL WITH GFR
ALK PHOS: 88 U/L (ref 39–117)
ALT: 15 U/L (ref 0–35)
AST: 17 U/L (ref 0–37)
Albumin: 4.3 g/dL (ref 3.5–5.2)
BILIRUBIN TOTAL: 0.6 mg/dL (ref 0.2–1.2)
BUN: 12 mg/dL (ref 6–23)
CO2: 25 mEq/L (ref 19–32)
Calcium: 9.1 mg/dL (ref 8.4–10.5)
Chloride: 97 mEq/L (ref 96–112)
Creat: 0.75 mg/dL (ref 0.50–1.10)
GFR, EST NON AFRICAN AMERICAN: 85 mL/min
GFR, Est African American: >89 mL/min
GLUCOSE: 92 mg/dL (ref 70–99)
Potassium: 4.2 mEq/L (ref 3.5–5.3)
Sodium: 131 mEq/L — ABNORMAL LOW (ref 135–145)
Total Protein: 6.8 g/dL (ref 6.0–8.3)

## 2013-12-11 LAB — POCT URINALYSIS DIPSTICK
Bilirubin, UA: NEGATIVE
Blood, UA: NEGATIVE
GLUCOSE UA: NEGATIVE
KETONES UA: NEGATIVE
LEUKOCYTES UA: NEGATIVE
Nitrite, UA: NEGATIVE
Protein, UA: NEGATIVE
SPEC GRAV UA: 1.015
UROBILINOGEN UA: 0.2
pH, UA: 8.5

## 2013-12-11 LAB — LIPID PANEL
Cholesterol: 238 mg/dL — ABNORMAL HIGH (ref 0–200)
HDL: 95 mg/dL
LDL Cholesterol: 128 mg/dL — ABNORMAL HIGH (ref 0–99)
Total CHOL/HDL Ratio: 2.5 ratio
Triglycerides: 75 mg/dL
VLDL: 15 mg/dL (ref 0–40)

## 2013-12-11 LAB — TSH: TSH: 1.485 u[IU]/mL (ref 0.350–4.500)

## 2013-12-11 MED ORDER — NYSTATIN 100000 UNIT/GM EX OINT: 1.0000 "application " | TOPICAL_OINTMENT | Freq: Two times a day (BID) | CUTANEOUS | Status: DC

## 2013-12-11 NOTE — Progress Notes (Signed)
Subjective:    Patient ID: Candice Saunders, female    DOB: Jun 20, 1951, 63 y.o.   MRN: [id number]  12/11/2013  Annual Exam   HPI [name] presents for evaluation of Complete Physical Examination.  Last physical 10/15/12. Colonoscopy 11/22/10. Repeat in 3 years.  Buccini.  Precancerous polyp with GI bleeding. Pap smear 2014. WNL; HPV negative. Mammogram 11/2013. Bone density 11/2013 unchanged.  Stopped Fosamax one year ago. TDAP 2014. Zostavax 10/2010.  Influenza vaccine 2014. Eye exam 2015. Candice Saunders. 07/2013.  No change.   Dental exam every six months.   Depression: patient desires to wean off of Citalopram; moved to [place]; exercising daily; socially involved; no longer socially isolated.  Sleeping well.  Dating same partner x 2.5 years.   Review of Systems  Constitutional: Negative.   HENT: Negative.   Eyes: Negative.   Respiratory: Negative.   Cardiovascular: Negative.   Gastrointestinal: Negative.   Endocrine: Negative.   Genitourinary: Negative.   Musculoskeletal: Negative.   Skin: Positive for color change. Negative for pallor, rash and wound.  Allergic/Immunologic: Negative.   Neurological: Negative.   Hematological: Negative.   Psychiatric/Behavioral: Negative.     Past Medical History  Diagnosis Date  . Osteopenia 09/29/2011  . Hx of colonic polyps 09/29/2011  . Allergy   . Depression   . Diverticulosis   . Hyperlipidemia   . Macular degeneration 10/09/2012    John Matthews/Retinal specialist   Past Surgical History  Procedure Laterality Date  . Rotator cuff repair  01/08/2005    Right  . Eye surgery      Cataract resection  . Colonoscopy w/ polypectomy  11/09/2010    Buccini; every 2-3 years  . Admission  07/11/2002    GI bleeding due to polyp.      No Known Allergies Current Outpatient Prescriptions  Medication Sig Dispense Refill  . aspirin 81 MG tablet Take 81 mg by mouth daily.      . citalopram (CELEXA) 10 MG tablet Take 1 tablet (10 mg  total) by mouth daily. PATIENT NEEDS OFFICE VISIT FOR ADDITIONAL REFILLS  30 tablet  0  . nystatin ointment (MYCOSTATIN) Apply 1 application topically 2 (two) times daily.  30 g  0   No current facility-administered medications for this visit.   History   Social History  . Marital Status: Single    Spouse Name: N/A    Number of Children: 0  . Years of Education: N/A   Occupational History  . retired     Teaching laboratory technician; retired in 2004.   Social History Main Topics  . Smoking status: Former Smoker -- 30 years    Types: Cigarettes    Quit date: 08/26/2006  . Smokeless tobacco: Never Used  . Alcohol Use: 10.8 oz/week    18 Cans of beer per week     Comment: drinks 2 beers daily; 4 beers on Friday and Saturdays.  . Drug Use: No  . Sexual Activity: Not Currently    Partners: Female     Comment: Same sex sexual partners   Other Topics Concern  . Not on file   Social History Narrative   Marital status: single.  Dating x 2 1/2 years  Dates same sexual partners.      Children: none     Lives: with ex-partner since 2012; moving out in 10/2012.Lives in [place].        Employment: retired since 2004; school psychologist x 30 years.  Tobacco: quit smoking 2004.       Alcohol:  2 beers per day; more on weekends (4 beers per weekend night).      Drugs: none      Exercise: daily walking 45 minutes.      Seatbelt: 100%      Sunscreen:  Sporadic.      Guns: none.      Sexual activity: same sex partners.                     Family History  Problem Relation Age of Onset  . Alzheimer's disease Mother   . Cancer Mother 61    Breast cancer  . Osteoporosis Mother   . Stroke Mother   . Heart disease Father 39    AMI, CHF  . Kidney disease Father 45    ESRD/HD  . Stroke Brother 73    TIA  . Hyperlipidemia Sister   . Hypertension Sister        Objective:    BP 140/80  Pulse 71  Temp(Src) 98.2 F (36.8 C) (Oral)  Resp 16  Ht 5' 2.75" (1.594 m)  Wt 151  lb 12.8 oz (68.856 kg)  BMI 27.10 kg/m2  SpO2 98% Physical Exam  Nursing note and vitals reviewed. Constitutional: She is oriented to person, place, and time. She appears well-developed and well-nourished. No distress.  HENT:  Head: Normocephalic and atraumatic.  Right Ear: External ear normal.  Left Ear: External ear normal.  Nose: Nose normal.  Mouth/Throat: Oropharynx is clear and moist.  Eyes: Conjunctivae and EOM are normal. Pupils are equal, round, and reactive to light.  Neck: Normal range of motion and full passive range of motion without pain. Neck supple. No JVD present. Carotid bruit is not present. No thyromegaly present.  Cardiovascular: Normal rate, regular rhythm, normal heart sounds and intact distal pulses.  Exam reveals no gallop and no friction rub.   No murmur heard. Pulmonary/Chest: Effort normal and breath sounds normal. She has no wheezes. She has no rales. Right breast exhibits skin change. Right breast exhibits no inverted nipple, no mass, no nipple discharge and no tenderness. Left breast exhibits no inverted nipple, no mass, no nipple discharge, no skin change and no tenderness. Breasts are symmetrical.    Abdominal: Soft. Bowel sounds are normal. She exhibits no distension and no mass. There is no tenderness. There is no rebound and no guarding.  Genitourinary: Rectum normal and uterus normal.    No breast swelling, tenderness, discharge or bleeding. There is no rash, tenderness or lesion on the right labia. There is no rash, tenderness or lesion on the left labia. Right adnexum displays no mass, no tenderness and no fullness. Left adnexum displays no mass, no tenderness and no fullness.  Area of scaling, erythema without drainage, pustules, vesicles.  Musculoskeletal: Normal range of motion.       Right shoulder: Normal.       Left shoulder: Normal.       Cervical back: Normal.  Lymphadenopathy:    She has no cervical adenopathy.  Neurological: She is alert  and oriented to person, place, and time. She has normal reflexes. No cranial nerve deficit. She exhibits normal muscle tone. Coordination normal.  Skin: Skin is warm and dry. No rash noted. She is not diaphoretic. No erythema. No pallor.  Psychiatric: She has a normal mood and affect. Her behavior is normal. Judgment and thought content normal.   Results for orders placed in  visit on 12/11/13  CBC WITH DIFFERENTIAL      Result Value Ref Range   WBC 5.9  4.0 - 10.5 K/uL   RBC 4.33  3.87 - 5.11 MIL/uL   Hemoglobin 13.6  12.0 - 15.0 g/dL   HCT 39.3  36.0 - 46.0 %   MCV 90.8  78.0 - 100.0 fL   MCH 31.4  26.0 - 34.0 pg   MCHC 34.6  30.0 - 36.0 g/dL   RDW 13.7  11.5 - 15.5 %   Platelets 321  150 - 400 K/uL   Neutrophils Relative % 60  43 - 77 %   Neutro Abs 3.5  1.7 - 7.7 K/uL   Lymphocytes Relative 28  12 - 46 %   Lymphs Abs 1.7  0.7 - 4.0 K/uL   Monocytes Relative 9  3 - 12 %   Monocytes Absolute 0.5  0.1 - 1.0 K/uL   Eosinophils Relative 2  0 - 5 %   Eosinophils Absolute 0.1  0.0 - 0.7 K/uL   Basophils Relative 1  0 - 1 %   Basophils Absolute 0.1  0.0 - 0.1 K/uL   Smear Review Criteria for review not met    COMPLETE METABOLIC PANEL WITH GFR      Result Value Ref Range   Sodium 131 (*) 135 - 145 mEq/L   Potassium 4.2  3.5 - 5.3 mEq/L   Chloride 97  96 - 112 mEq/L   CO2 25  19 - 32 mEq/L   Glucose, Bld 92  70 - 99 mg/dL   BUN 12  6 - 23 mg/dL   Creat 0.75  0.50 - 1.10 mg/dL   Total Bilirubin 0.6  0.2 - 1.2 mg/dL   Alkaline Phosphatase 88  39 - 117 U/L   AST 17  0 - 37 U/L   ALT 15  0 - 35 U/L   Total Protein 6.8  6.0 - 8.3 g/dL   Albumin 4.3  3.5 - 5.2 g/dL   Calcium 9.1  8.4 - 10.5 mg/dL   GFR, Est African American >89     GFR, Est Non African American 85    HEMOGLOBIN A1C      Result Value Ref Range   Hemoglobin A1C 5.4  <5.7 %   Mean Plasma Glucose 108  <117 mg/dL  LIPID PANEL      Result Value Ref Range   Cholesterol 238 (*) 0 - 200 mg/dL   Triglycerides 75  <150  mg/dL   HDL 95  >39 mg/dL   Total CHOL/HDL Ratio 2.5     VLDL 15  0 - 40 mg/dL   LDL Cholesterol 128 (*) 0 - 99 mg/dL  TSH      Result Value Ref Range   TSH 1.485  0.350 - 4.500 uIU/mL  POCT UA - MICROSCOPIC ONLY      Result Value Ref Range   WBC, Ur, HPF, POC 0-1     RBC, urine, microscopic 0-1     Bacteria, U Microscopic trace     Mucus, UA neg     Epithelial cells, urine per micros 0-1     Crystals, Ur, HPF, POC neg     Casts, Ur, LPF, POC neg     Yeast, UA neg    POCT URINALYSIS DIPSTICK      Result Value Ref Range   Color, UA yellow     Clarity, UA clear     Glucose, UA neg  Bilirubin, UA neg     Ketones, UA neg     Spec Grav, UA 1.015     Blood, UA neg     pH, UA 8.5     Protein, UA neg     Urobilinogen, UA 0.2     Nitrite, UA neg     Leukocytes, UA Negative         Assessment & Plan:  Routine general medical examination at a health care facility - Plan: EKG 12-Lead, POCT UA - Microscopic Only, POCT urinalysis dipstick, CBC with Differential, COMPLETE METABOLIC PANEL WITH GFR, Hemoglobin A1c, Lipid panel, TSH  Seasonal allergies  Depression  Osteopenia  Dyslipidemia  Hx of colonic polyps  Candidiasis of anus  Skin lesion of breast  1. Complete Physical Examination:  Anticipatory guidance --- weight maintenance, exercise continued.  Pap smear UTD 2014. Mammogram and bone density UTD. Refer for colonoscopy.  Immunizations UTD. 2.  Depression: improved/resolved; wean off of Citalopram to qod for two weeks and then stop. 3.  Osteopenia: stable; no change in recent bone density scan.  Recommend 3 servings of dairy daily or Calcium supplement daily.  S/p Fosamax for five years; stopped in 2014. 4.  Dyslipidemia: persistent; if cholesterol remains elevated, patient agreeable to starting statin. 5.  Colon polyps: refer to [facility] for repeat colonoscopy.   6.  Breast R Skin Lesion:  New. Recommend dermatology consultation; pt to contact Dr. Denna Haggard. 7.   Candidiasis of anus:  New. Rx for nystatin cream bid for two weeks; if no improvement, call office.  Meds ordered this encounter  Medications  . aspirin 81 MG tablet    Sig: Take 81 mg by mouth daily.  Marland Kitchen nystatin ointment (MYCOSTATIN)    Sig: Apply 1 application topically 2 (two) times daily.    Dispense:  30 g    Refill:  0    Return in about 1 year (around 12/12/2014) for complete physical examiniation.   Reginia Forts, M.D.  [facility] [address] [place], [place]  [address] [phone] phone [phone] fax

## 2013-12-11 NOTE — Patient Instructions (Signed)
1. Start Calcium one tablet at bedtime. 2. Call Dr. Cristina Gong at [facility] for colonoscopy. 3. Call Dr. Onalee Hua office for appointment. 4.  Use topical cream twice daily for two weeks; if rash is not improved, call Dr. Tamala Julian. 5.  Wean Celexa to one tablet every other day for two weeks and then stop.

## 2013-12-12 ENCOUNTER — Encounter: Payer: Self-pay | Admitting: Family Medicine

## 2013-12-17 ENCOUNTER — Encounter: Payer: Self-pay | Admitting: Family Medicine

## 2013-12-23 ENCOUNTER — Encounter: Payer: Self-pay | Admitting: Family Medicine

## 2013-12-25 ENCOUNTER — Telehealth: Payer: Self-pay

## 2013-12-25 MED ORDER — KETOCONAZOLE-HYDROCORTISONE 2 & 1 % (CREAM) EX KIT: 1.0000 "application " | PACK | Freq: Two times a day (BID) | CUTANEOUS | Status: DC

## 2013-12-25 NOTE — Telephone Encounter (Signed)
Call---sent new rx to [facility] on [address] to use for two weeks. If no improvement in two weeks, please have patient call office again.

## 2013-12-25 NOTE — Telephone Encounter (Signed)
Dr. Tamala Julian,   Patient states her rash is NOT any better.    [phone]

## 2013-12-25 NOTE — Telephone Encounter (Signed)
Called patient to advise  °

## 2013-12-25 NOTE — Telephone Encounter (Signed)
Spoke to patient and advised her that Dr. Tamala Julian is out of the office all week, and that I can send her message to another provider, however, she stated that this would be "counterproductive" and that she will just call back next week when Dr. Tamala Julian is in the office.  She asked me to please let Dr. Tamala Julian know that she had called and I reassured her that I would do so.

## 2014-02-24 ENCOUNTER — Telehealth: Payer: Self-pay

## 2014-02-24 NOTE — Telephone Encounter (Signed)
Pt states she has been using her rx for rash but sees no improvement Please advise   Best phone [phone]

## 2014-02-24 NOTE — Telephone Encounter (Signed)
lmom pt will need to RTC for evaluation. lasr seen 12/11/2013

## 2014-03-03 ENCOUNTER — Encounter: Payer: Self-pay | Admitting: Family Medicine

## 2014-03-03 ENCOUNTER — Ambulatory Visit (INDEPENDENT_AMBULATORY_CARE_PROVIDER_SITE_OTHER): Payer: Medicare Other | Admitting: Family Medicine

## 2014-03-03 VITALS — BP 132/86 | HR 63 | Temp 97.8°F | Resp 16 | Ht 63.0 in | Wt 151.8 lb

## 2014-03-03 DIAGNOSIS — R21 Rash and other nonspecific skin eruption: Secondary | ICD-10-CM

## 2014-03-03 DIAGNOSIS — Z23 Encounter for immunization: Secondary | ICD-10-CM

## 2014-03-03 LAB — POCT SKIN KOH: Skin KOH, POC: NEGATIVE

## 2014-03-03 MED ORDER — FLUCONAZOLE 100 MG PO TABS: 100.0000 mg | ORAL_TABLET | Freq: Every day | ORAL | Status: DC

## 2014-03-03 MED ORDER — TERCONAZOLE 0.4 % VA CREA: 1.0000 | TOPICAL_CREAM | Freq: Two times a day (BID) | VAGINAL | Status: DC

## 2014-03-03 NOTE — Progress Notes (Signed)
Subjective:    Patient ID: Candice Saunders, female    DOB: [birth year], 63 y.o.   MRN: [id number]  03/03/2014  Rash near rectum   HPI [name] presents for two month follow-up of perianal rash.  Evaluated on 12/11/13 during CPE erythematous scaling rash noted; mild itching of area noted at that time.  Rx for Nystatin cream provided to use for two weeks; advised to call office if no improvement in two weeks.  Patient called on 12/25/13 to advise that rash not improved.  Rx for Ketoconazole-hydrocortisone cream 2-1% provided to apply bid.  Patient now reporting that itching is much improved but rash is still persistent; pt now concerned with etiology or that rash may represent an underlying issue.    S/p evaluation by dermatology regarding skin changes on nipple; s/p biopsy that was negative.   Review of Systems  Constitutional: Negative for fever, chills, diaphoresis and fatigue.  Gastrointestinal: Negative for nausea, vomiting, abdominal pain, diarrhea and constipation.  Skin: Positive for color change, rash and wound. Negative for pallor.    Past Medical History  Diagnosis Date  . Osteopenia 09/29/2011  . Hx of colonic polyps 09/29/2011  . Allergy   . Depression   . Diverticulosis   . Hyperlipidemia   . Macular degeneration 10/09/2012    Candice Saunders/Retinal specialist   Past Surgical History  Procedure Laterality Date  . Rotator cuff repair  01/08/2005    Right  . Eye surgery      Cataract resection  . Colonoscopy w/ polypectomy  11/09/2010    Buccini; every 2-3 years  . Admission  07/11/2002    GI bleeding due to polyp.     No Known Allergies Current Outpatient Prescriptions  Medication Sig Dispense Refill  . aspirin 81 MG tablet Take 81 mg by mouth daily.      Marland Kitchen Ketoconazole-Hydrocortisone 2 & 1 % (CREAM) KIT Apply 1 application topically 2 (two) times daily.  102 g  0  . citalopram (CELEXA) 10 MG tablet Take 1 tablet (10 mg total) by mouth daily. PATIENT NEEDS  OFFICE VISIT FOR ADDITIONAL REFILLS  30 tablet  0  . fluconazole (DIFLUCAN) 100 MG tablet Take 1 tablet (100 mg total) by mouth daily.  7 tablet  0  . nystatin ointment (MYCOSTATIN) Apply 1 application topically 2 (two) times daily.  30 g  0  . terconazole (TERAZOL 7) 0.4 % vaginal cream Place 1 applicator vaginally 2 (two) times daily.  45 g  0   No current facility-administered medications for this visit.   History   Social History  . Marital Status: Single    Spouse Name: N/A    Number of Children: 0  . Years of Education: N/A   Occupational History  . retired     Teaching laboratory technician; retired in 2004.   Social History Main Topics  . Smoking status: Former Smoker -- 30 years    Types: Cigarettes    Quit date: 08/26/2006  . Smokeless tobacco: Never Used  . Alcohol Use: 10.8 oz/week    18 Cans of beer per week     Comment: drinks 2 beers daily; 4 beers on Friday and Saturdays.  . Drug Use: No  . Sexual Activity: Not Currently    Partners: Female     Comment: Same sex sexual partners   Other Topics Concern  . Not on file   Social History Narrative   Marital status: single.  Dating x 2 1/2  years  Dates same sexual partners.      Children: none     Lives: with ex-partner since 2012; moving out in 10/2012.Lives in [place].        Employment: retired since 2004; school psychologist x 30 years.        Tobacco: quit smoking 2004.       Alcohol:  2 beers per day; more on weekends (4 beers per weekend night).      Drugs: none      Exercise: daily walking 45 minutes.      Seatbelt: 100%      Sunscreen:  Sporadic.      Guns: none.      Sexual activity: same sex partners.                     Family History  Problem Relation Age of Onset  . Alzheimer's disease Mother   . Cancer Mother 91    Breast cancer  . Osteoporosis Mother   . Stroke Mother   . Heart disease Father 65    AMI, CHF  . Kidney disease Father 76    ESRD/HD  . Stroke Brother 73    TIA  .  Hyperlipidemia Sister   . Hypertension Sister        Objective:    BP 132/86  Pulse 63  Temp(Src) 97.8 F (36.6 C) (Oral)  Resp 16  Ht _0  (1.6 m)  Wt 151 lb 12.8 oz (68.856 kg)  BMI 26.90 kg/m2  SpO2 99% Physical Exam  Nursing note and vitals reviewed. Constitutional: She is oriented to person, place, and time. She appears well-developed and well-nourished. No distress.  HENT:  Head: Normocephalic and atraumatic.  Eyes: Conjunctivae are normal. Pupils are equal, round, and reactive to light.  Neck: Normal range of motion. Neck supple.  Cardiovascular: Normal rate, regular rhythm and normal heart sounds.  Exam reveals no gallop and no friction rub.   No murmur heard. Pulmonary/Chest: Effort normal and breath sounds normal. She has no wheezes. She has no rales.  Neurological: She is alert and oriented to person, place, and time.  Skin: Skin is warm and dry. Rash noted. She is not diaphoretic. There is erythema.  3cm x 4 cm rash along gluteal fold without scaling with superficial excoriations; well defined border that is mildly scaling at this time.  No satellite lesions. No vesicles or pustules.    Psychiatric: She has a normal mood and affect. Her behavior is normal.   Results for orders placed in visit on 03/03/14  POCT SKIN KOH      Result Value Ref Range   Skin KOH, POC Negative     INFLUENZA VACCINE ADMINISTERED.    Assessment & Plan:   1. Need for prophylactic vaccination and inoculation against influenza   2. Rash of genital area    1. Rash gluteal genital region: Persistent despite nystatin cream and despite ketoconazole-hydrocortisone cream.  Rx for Terazol cream provided to use bid for two weeks. Refer to dermatology due to persistence.  Will warrant biopsy if persists.  KOH negative today but small sample size.  Hot weather and location of rash may also be interfering with healing of a benign process. 2. S/p flu vaccine in office.  Meds ordered this encounter   Medications  . terconazole (TERAZOL 7) 0.4 % vaginal cream    Sig: Place 1 applicator vaginally 2 (two) times daily.    Dispense:  45 g  Refill:  0  . fluconazole (DIFLUCAN) 100 MG tablet    Sig: Take 1 tablet (100 mg total) by mouth daily.    Dispense:  7 tablet    Refill:  0    No Follow-up on file.   Reginia Forts, M.D.  [facility] [address] [place], [place]  [address] [phone] phone [phone] fax

## 2014-05-15 ENCOUNTER — Telehealth: Payer: Self-pay

## 2014-05-15 NOTE — Telephone Encounter (Signed)
Candice Saunders - Pt wants a referral to a gynocologist that specialized in oncology or disease.  Wants to also know if there was any indication of blood in her urine. [phone]

## 2014-05-19 ENCOUNTER — Encounter: Payer: Self-pay | Admitting: Family Medicine

## 2014-05-19 ENCOUNTER — Ambulatory Visit (INDEPENDENT_AMBULATORY_CARE_PROVIDER_SITE_OTHER): Payer: Medicare Other | Admitting: Family Medicine

## 2014-05-19 VITALS — BP 144/72 | HR 82 | Temp 98.6°F | Resp 16 | Ht 63.25 in | Wt 144.6 lb

## 2014-05-19 DIAGNOSIS — C4499 Other specified malignant neoplasm of skin, unspecified: Secondary | ICD-10-CM

## 2014-05-19 DIAGNOSIS — Z23 Encounter for immunization: Secondary | ICD-10-CM

## 2014-05-19 LAB — POCT UA - MICROSCOPIC ONLY
CASTS, UR, LPF, POC: NEGATIVE
CRYSTALS, UR, HPF, POC: NEGATIVE
Mucus, UA: POSITIVE
Yeast, UA: NEGATIVE

## 2014-05-19 LAB — POCT URINALYSIS DIPSTICK
Bilirubin, UA: NEGATIVE
Glucose, UA: NEGATIVE
Ketones, UA: NEGATIVE
Nitrite, UA: NEGATIVE
PH UA: 6
PROTEIN UA: NEGATIVE
RBC UA: NEGATIVE
Spec Grav, UA: <=1.005
UROBILINOGEN UA: 0.2

## 2014-05-19 NOTE — Progress Notes (Signed)
Subjective:    Patient ID: Candice Saunders, female    DOB: 1951/05/29, 63 y.o.   MRN: [id number]  05/19/2014  Discuss test results   HPI [name] presents for evaluation to discuss recent pathology results and diagnosis. Patient referred to dermatology on 03/03/14 due to persistent peri-anal rash that did not improve with Nystatin cream, Ketoconazole-hydrocortisone 2-1%, Terconazole cream, and Diflucan.  Evaluated by Dr. 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 Dr. Denna Haggard then referred patient to general surgery. Pt underwent consultation by colorectal surgeon in [place] who recommended resection of the perianal lesion at a tertiary care center.  Surgeon also referred pt to GI for colonoscopy.  Surgeon recommended pt follow-up with PCP for gynecological assessment and urological assessment.    Appointment with Dr. Cristina Gong is 06/29/14; pt wants sooner appointment.   Needs referral to gynecology. Needs referral to urology.    Surgical resection is recommended.  Needs general surgery consultation; concerned that local specialist has never completed this surgery.    No one has mentioned oncology consultation to patient.    Denies hematuria, recurrent UTIs, vaginal itching or bleeding; denies bloating.  Denies unintentional weight loss or new onset night sweats.    Last colonoscopy 2012; now due for repeat.   Pap smear 10/25/2012 WNL; HPV negative. Mammogram 11/18/13 WNL.   Review of Systems  Constitutional: Negative for fever, diaphoresis, activity change, appetite change, fatigue and unexpected weight change.  Gastrointestinal: Negative for nausea, vomiting, abdominal pain, diarrhea, constipation, blood in stool and rectal pain.  Genitourinary: Negative for dysuria, urgency, frequency, hematuria, flank pain, decreased urine volume, vaginal bleeding, vaginal  discharge, enuresis, difficulty urinating, genital sores, vaginal pain, menstrual problem and pelvic pain.  Skin: Positive for rash.    Past Medical History  Diagnosis Date  . Osteopenia 09/29/2011  . Hx of colonic polyps 09/29/2011  . Allergy   . Depression   . Diverticulosis   . Hyperlipidemia   . Macular degeneration 10/09/2012    John Matthews/Retinal specialist   Past Surgical History  Procedure Laterality Date  . Rotator cuff repair  01/08/2005    Right  . Eye surgery      Cataract resection  . Colonoscopy w/ polypectomy  11/09/2010    Buccini; every 2-3 years  . Admission  07/11/2002    GI bleeding due to polyp.     No Known Allergies Current Outpatient Prescriptions  Medication Sig Dispense Refill  . aspirin 81 MG tablet Take 81 mg by mouth daily.    . fluconazole (DIFLUCAN) 100 MG tablet Take 1 tablet (100 mg total) by mouth daily. 7 tablet 0  . Ketoconazole-Hydrocortisone 2 & 1 % (CREAM) KIT Apply 1 application topically 2 (two) times daily. 102 g 0  . terconazole (TERAZOL 7) 0.4 % vaginal cream Place 1 applicator vaginally 2 (two) times daily. 45 g 0   No current facility-administered medications for this visit.       Objective:    BP 144/72 mmHg  Pulse 82  Temp(Src) 98.6 F (37 C) (Oral)  Resp 16  Ht 5' 3.25" (1.607 m)  Wt 144 lb 9.6 oz (65.59 kg)  BMI 25.40 kg/m2  SpO2 100% Physical Exam  Constitutional: She is oriented to person, place, and time. She appears well-developed and well-nourished. No distress.  HENT:  Head: Normocephalic and atraumatic.  Eyes: Conjunctivae are normal. Pupils are equal,  round, and reactive to light.  Neck: Normal range of motion. Neck supple.  Cardiovascular: Normal rate, regular rhythm and normal heart sounds.  Exam reveals no gallop and no friction rub.   No murmur heard. Pulmonary/Chest: Effort normal and breath sounds normal. She has no wheezes. She has no rales.  Abdominal: Soft. Bowel sounds are normal. She exhibits no  distension and no mass. There is no tenderness. There is no rebound and no guarding.  Neurological: She is alert and oriented to person, place, and time.  Skin: She is not diaphoretic.  Psychiatric: She has a normal mood and affect. Her behavior is normal.  Nursing note and vitals reviewed.  Results for orders placed or performed in visit on 05/19/14  POCT urinalysis dipstick  Result Value Ref Range   Color, UA yellow    Clarity, UA cloudy    Glucose, UA neg    Bilirubin, UA neg    Ketones, UA neg    Spec Grav, UA <=1.005    Blood, UA neg    pH, UA 6.0    Protein, UA neg    Urobilinogen, UA 0.2    Nitrite, UA neg    Leukocytes, UA small (1+)   POCT UA - Microscopic Only  Result Value Ref Range   WBC, Ur, HPF, POC 17-29    RBC, urine, microscopic 4-9    Bacteria, U Microscopic 1+    Mucus, UA pos    Epithelial cells, urine per micros 0-1    Crystals, Ur, HPF, POC neg    Casts, Ur, LPF, POC neg    Yeast, UA neg    PREVNAR 13 ADMINISTERED.    Assessment & Plan:   1. Extramammary Paget's disease   2. Need for prophylactic vaccination against Streptococcus pneumoniae (pneumococcus)     1.  Extramammary Paget's disease:  New.  Discussed case with Dr. Suezanne Jacquet Hopkins/Colorectal Surgeon [facility] who recommended EGD and colonoscopy.  If negative, refer to colorectal surgeon for resection.  Also will refer to urology to rule out urological malignancy. Refer to gynecology for extensive evaluation for gynecological malignancy.  Pt scheduled for colonoscopy on Thursday, 05/22/14 with Buccini.  Called Dr. Osborn Coho office to advise that colorectal surgeon also recommending EGD. 2. S/p Prevnary-13.   No orders of the defined types were placed in this encounter.    No Follow-up on file.    Reginia Forts, M.D.  [facility] [address] [place], [place]  [address] [phone] phone [phone] fax

## 2014-05-20 NOTE — Telephone Encounter (Signed)
Patient evaluated in the office on 05/19/2014.

## 2014-05-20 NOTE — Addendum Note (Signed)
Addended by: Wardell Honour on: 05/20/2014 02:20 PM   Modules accepted: Orders

## 2014-05-21 ENCOUNTER — Telehealth: Payer: Self-pay

## 2014-05-21 NOTE — Telephone Encounter (Signed)
Referrals, [facility] is not accepting MCR pt's. Can we refer her somewhere else please. Thanks

## 2014-05-26 ENCOUNTER — Telehealth: Payer: Self-pay

## 2014-05-26 NOTE — Telephone Encounter (Signed)
Dr. Tamala Julian, [facility] wanted me to let you know that pt is going to be seeing Dr. Irine Seal on Thurs at 230  MR-Can we please send notes/records to them at [phone]. Thanks

## 2014-05-26 NOTE — Telephone Encounter (Signed)
Noted  

## 2014-05-26 NOTE — Telephone Encounter (Signed)
Has this been done yet?

## 2014-05-27 NOTE — Addendum Note (Signed)
Addended by: Wardell Honour on: 05/27/2014 09:23 PM   Modules accepted: Orders

## 2014-05-28 ENCOUNTER — Telehealth: Payer: Self-pay

## 2014-05-28 NOTE — Telephone Encounter (Signed)
Dr. Tamala Julian,   Patient was calling to inform you that she has made her own appointment for gynecology- for 05/29/14 at 8:30am with Dr. Alwyn Pea at [facility]. and made her own appointment for urology- with Dr. Jeffie Pollock 05/29/14 at 2:30pm at [facility]. Patient wanted to let you know that she is very disappointed with referral process and staff.   Best: [phone]

## 2014-06-09 ENCOUNTER — Encounter: Payer: Self-pay | Admitting: Family Medicine

## 2014-06-26 ENCOUNTER — Encounter: Payer: Self-pay | Admitting: Family Medicine

## 2014-07-06 HISTORY — PX: OTHER SURGICAL HISTORY: SHX169

## 2014-07-07 DIAGNOSIS — C4499 Other specified malignant neoplasm of skin, unspecified: Secondary | ICD-10-CM | POA: Insufficient documentation

## 2014-07-21 ENCOUNTER — Encounter: Payer: Self-pay | Admitting: Family Medicine

## 2014-08-13 ENCOUNTER — Ambulatory Visit (INDEPENDENT_AMBULATORY_CARE_PROVIDER_SITE_OTHER): Payer: BC Managed Care – PPO | Admitting: Ophthalmology

## 2014-10-14 ENCOUNTER — Ambulatory Visit (INDEPENDENT_AMBULATORY_CARE_PROVIDER_SITE_OTHER): Payer: Self-pay | Admitting: Ophthalmology

## 2014-10-15 ENCOUNTER — Encounter: Payer: Self-pay | Admitting: *Deleted

## 2014-10-20 ENCOUNTER — Ambulatory Visit (INDEPENDENT_AMBULATORY_CARE_PROVIDER_SITE_OTHER): Payer: Medicare Other | Admitting: Ophthalmology

## 2014-10-20 DIAGNOSIS — H43813 Vitreous degeneration, bilateral: Secondary | ICD-10-CM

## 2014-10-20 DIAGNOSIS — H3531 Nonexudative age-related macular degeneration: Secondary | ICD-10-CM

## 2014-10-21 ENCOUNTER — Telehealth: Payer: Self-pay | Admitting: *Deleted

## 2014-10-21 NOTE — Telephone Encounter (Signed)
Patient had returned my call and I returned her call.  Scheduled CPE with Dr. Tamala Julian for 11/12/14 @ 1100.  Patient verbalized appreciation & understanding.

## 2014-10-21 NOTE — Telephone Encounter (Signed)
Patient had phoned in response of letter I sent and [facility].  Phoned number she stated and [place] back.

## 2014-11-12 ENCOUNTER — Ambulatory Visit (INDEPENDENT_AMBULATORY_CARE_PROVIDER_SITE_OTHER): Payer: Medicare Other | Admitting: Family Medicine

## 2014-11-12 ENCOUNTER — Encounter: Payer: Self-pay | Admitting: Family Medicine

## 2014-11-12 VITALS — BP 126/73 | HR 63 | Temp 98.1°F | Resp 16 | Ht 63.0 in | Wt 149.6 lb

## 2014-11-12 DIAGNOSIS — Z86010 Personal history of colonic polyps: Secondary | ICD-10-CM | POA: Diagnosis not present

## 2014-11-12 DIAGNOSIS — E785 Hyperlipidemia, unspecified: Secondary | ICD-10-CM

## 2014-11-12 DIAGNOSIS — M8580 Other specified disorders of bone density and structure, unspecified site: Secondary | ICD-10-CM

## 2014-11-12 DIAGNOSIS — Z0000 Encounter for general adult medical examination without abnormal findings: Secondary | ICD-10-CM

## 2014-11-12 DIAGNOSIS — C4499 Other specified malignant neoplasm of skin, unspecified: Secondary | ICD-10-CM

## 2014-11-12 NOTE — Patient Instructions (Signed)

## 2014-11-12 NOTE — Progress Notes (Signed)
Subjective:    Patient ID: Candice Saunders, female    DOB: [birth year], 64 y.o.   MRN: [id number]  HPI This 64 y.o. female presents for Welcome to Medicare CPE.  Last physical:  12-11-2013 Pap smear:  10-2012 WNL; repeat pap smear in 2015.    Does not trust referral department.  Given appointment and had no appointment because did not take Medicare; given another appointment and switched MDs for patient who was hospitalist; pt made own appointment at [facility].   Mammogram:  11-18-13 [facility].   Colonoscopy:  05-22-14  Buccini; repeat 2 years. Bone density:  11-18-13 TDAP: 2014 Pneumovax:  05-19-2014 prevnar 13 Zostavax:  2012 Influenza:  2015 Eye exam:  UTD; +retinal exam WNL; [name].  No change. Dental exam:   Every six motnhs.   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  S/p repeat EGD and colonoscopy; all WNL.  Felt good about screening.  Three days after Christmas, with Dr. Georgiann Cocker.  Did not like Garment/textile technologist.  Dr. Georgiann Cocker had one recently and young woman who is still under care.  07-29-14 excised very large area to get margins.  With this surgery, does not get margins as going along with surgery.  Pathology came back with Paget's cells in one spot; remaining of margins clear.  Only dermal cells included.  Wound care nurse became best friend.  [facility] did not know much about of wound care; stayed in hospital for two nights due to bleeding; treated with sliver nitrate.  Sister with pt for one week; silver foam dressing. Hsa used every since; also had HH; twice per week to begin with; now coing once per week; continues to slowly fill in; two follow ups with silver nitrate treatments.  Filling in well.  No infection.  Staying very clean.  Had on megadose of Metamucil.  Big on fiber.  Lots of care and become "my new normal.  Down to sharpy pinpoint.  Tissue is hypergranulated which ias a  quick bleed ubt is filling in.  Dressing was silver foam; wound care switched to wicking dressing covered over with silver foam; can cut bandages smaller.  Has all supplies from supply house.  Waiting out the wound healing.  Scar tissue hard surface; followed every six months.  50% chance of recurrence.  Was late for appointments.  Appointments ran really behind.  Responded very quickly with emails and phone calls.  Described as pre-cancerous stage.       Review of Systems  Constitutional: Negative.  Negative for fever, chills, diaphoresis, activity change, appetite change, fatigue and unexpected weight change.  HENT: Negative.  Negative for congestion, dental problem, drooling, ear discharge, ear pain, facial swelling, hearing loss, mouth sores, nosebleeds, postnasal drip, rhinorrhea, sinus pressure, sneezing, sore throat, tinnitus, trouble swallowing and voice change.   Eyes: Negative.  Negative for photophobia, pain, discharge, redness, itching and visual disturbance.  Respiratory: Negative.  Negative for apnea, cough, choking, chest tightness, shortness of breath, wheezing and stridor.   Cardiovascular: Negative.  Negative for chest pain, palpitations and leg swelling.  Gastrointestinal: Negative.  Negative for nausea, vomiting, abdominal pain, diarrhea, constipation, blood in stool, abdominal distention, anal bleeding and rectal pain.  Endocrine: Negative.  Negative for cold intolerance, heat intolerance, polydipsia, polyphagia and polyuria.  Genitourinary: Negative.  Negative for dysuria, urgency, frequency, hematuria, flank pain, decreased urine volume, vaginal bleeding, vaginal discharge, enuresis, difficulty urinating, genital sores, vaginal  pain, menstrual problem, pelvic pain and dyspareunia.  Musculoskeletal: Negative.  Negative for myalgias, back pain, joint swelling, arthralgias, gait problem, neck pain and neck stiffness.  Skin: Positive for color change and wound. Negative for pallor  and rash.  Allergic/Immunologic: Negative.  Negative for environmental allergies, food allergies and immunocompromised state.  Neurological: Negative.  Negative for dizziness, tremors, seizures, syncope, facial asymmetry, speech difficulty, weakness, light-headedness, numbness and headaches.  Hematological: Negative.  Negative for adenopathy. Does not bruise/bleed easily.  Psychiatric/Behavioral: Negative.  Negative for suicidal ideas, hallucinations, behavioral problems, confusion, sleep disturbance, self-injury, dysphoric mood, decreased concentration and agitation. The patient is not nervous/anxious and is not hyperactive.    Past Medical History  Diagnosis Date  . Osteopenia 09/29/2011  . Hx of colonic polyps 09/29/2011  . Allergy   . Depression   . Diverticulosis   . Hyperlipidemia   . Macular degeneration 10/09/2012    [facility]/Retinal specialist  . Osteoporosis   . Extra mammary Paget disease    Past Surgical History  Procedure Laterality Date  . Rotator cuff repair  01/08/2005    Right  . Eye surgery      Cataract resection  . Colonoscopy w/ polypectomy  11/09/2010    Buccini; every 2-3 years  . Admission  07/11/2002    GI bleeding due to polyp.     No Known Allergies  History   Social History  . Marital Status: Single    Spouse Name: N/A  . Number of Children: 0  . Years of Education: N/A   Occupational History  . retired     Teaching laboratory technician; retired in 2004.   Social History Main Topics  . Smoking status: Former Smoker -- 30 years    Types: Cigarettes    Quit date: 08/26/2006  . Smokeless tobacco: Never Used  . Alcohol Use: 10.8 oz/week    18 Cans of beer per week     Comment: drinks 2 beers daily; 4 beers on Friday and Saturdays.  . Drug Use: No  . Sexual Activity:    Partners: Female     Comment: Same sex sexual partners   Other Topics Concern  . Not on file   Social History Narrative   Marital status: single.  Dating x  So so.        Children:  none     Lives: with ex-partner since 2012; moving out in 10/2012.Lives in [place] in home.        Employment: retired since 2004; school psychologist x 30 years.        Tobacco: quit smoking 2004.       Alcohol:  2 beers per day; more on weekends (4 beers per weekend night).        Drugs: none      Exercise: none since 05/2014.      Seatbelt: 100%      Sunscreen:  Sporadic.      Guns: none.      Sexual activity: same sex partners.                     Family History  Problem Relation Age of Onset  . Alzheimer's disease Mother   . Cancer Mother 40    Breast cancer  . Osteoporosis Mother   . Stroke Mother   . Heart disease Father 25    AMI, CHF  . Kidney disease Father 17    ESRD/HD  . Hyperlipidemia Father   . Hypertension Father   .  Stroke Brother 73    TIA  . Hyperlipidemia Sister   . Hypertension Sister        Objective:   Physical Exam  Constitutional: She is oriented to person, place, and time. She appears well-developed and well-nourished. No distress.  HENT:  Head: Normocephalic and atraumatic.  Right Ear: External ear normal.  Left Ear: External ear normal.  Nose: Nose normal.  Mouth/Throat: Oropharynx is clear and moist.  Eyes: Conjunctivae and EOM are normal. Pupils are equal, round, and reactive to light.  Neck: Normal range of motion and full passive range of motion without pain. Neck supple. No JVD present. Carotid bruit is not present. No thyromegaly present.  Cardiovascular: Normal rate, regular rhythm and normal heart sounds.  Exam reveals no gallop and no friction rub.   No murmur heard. Pulmonary/Chest: Effort normal and breath sounds normal. She has no wheezes. She has no rales. Right breast exhibits no inverted nipple, no mass, no nipple discharge, no skin change and no tenderness. Left breast exhibits no inverted nipple, no mass, no nipple discharge, no skin change and no tenderness. Breasts are symmetrical.  Abdominal: Soft. Bowel sounds are normal.  She exhibits no distension and no mass. There is no tenderness. There is no rebound and no guarding.  Genitourinary:     Well healed scar with 35mm central area with scant yellow-bloody drainage.  Musculoskeletal:       Right shoulder: Normal.       Left shoulder: Normal.       Cervical back: Normal.  Lymphadenopathy:    She has no cervical adenopathy.  Neurological: She is alert and oriented to person, place, and time. She has normal reflexes. No cranial nerve deficit. She exhibits normal muscle tone. Coordination normal.  Skin: Skin is warm and dry. No rash noted. She is not diaphoretic. No erythema. No pallor.  Psychiatric: She has a normal mood and affect. Her behavior is normal. Judgment and thought content normal.  Nursing note and vitals reviewed.      Assessment & Plan:   1. Medicare welcome exam   2. Dyslipidemia   3. Osteopenia   4. Hx of colonic polyps   5. Extra mammary Paget disease     1. Medicare Annual Wellness examination and Complete Physical Examination: anticipatory guidance -- restart exercise program, 3 servings of dairy daily.  Pap smear and mammogram UTD.  Colonoscopy UTD.  Bone density UTD.  Immunizations UTD.  Independent with ADLs.  Low fall risk.  No hearing loss.  No evidence of depression. Has living will; desires FULL CODE. 2.  Dyslipidemia: stable; obtain labs; continue with exercise, exercise, dietary modification. 3.  Osteopenia: stable; 3 servings of dairy daily; restart exercise program; DEXA UTD. 4.  Colon polyps: colonoscopy UTD. 5.  Extramammary Paget disease: s/p surgical resection by [facility].  Prolonged wound healing.  Doing well; coping well emotionally.   Meds ordered this encounter  Medications  . Multiple Vitamins-Minerals (PRESERVISION AREDS 2 PO)    Sig: Take by mouth.    Candice Saunders, M.D. [facility] [address] [place], [place]  [id number] [phone] phone [phone] fax

## 2014-11-13 ENCOUNTER — Encounter: Payer: Self-pay | Admitting: Family Medicine

## 2014-11-13 LAB — CBC WITH DIFFERENTIAL/PLATELET
BASOS ABS: 0.1 10*3/uL (ref 0.0–0.1)
Basophils Relative: 1 % (ref 0–1)
EOS ABS: 0.1 10*3/uL (ref 0.0–0.7)
Eosinophils Relative: 1 % (ref 0–5)
HCT: 39.6 % (ref 36.0–46.0)
Hemoglobin: 13.7 g/dL (ref 12.0–15.0)
LYMPHS PCT: 29 % (ref 12–46)
Lymphs Abs: 1.6 10*3/uL (ref 0.7–4.0)
MCH: 31.7 pg (ref 26.0–34.0)
MCHC: 34.6 g/dL (ref 30.0–36.0)
MCV: 91.7 fL (ref 78.0–100.0)
MONO ABS: 0.5 10*3/uL (ref 0.1–1.0)
MPV: 10.1 fL (ref 8.6–12.4)
Monocytes Relative: 9 % (ref 3–12)
Neutro Abs: 3.2 10*3/uL (ref 1.7–7.7)
Neutrophils Relative %: 60 % (ref 43–77)
PLATELETS: 325 10*3/uL (ref 150–400)
RBC: 4.32 MIL/uL (ref 3.87–5.11)
RDW: 12.9 % (ref 11.5–15.5)
WBC: 5.4 10*3/uL (ref 4.0–10.5)

## 2014-11-13 LAB — COMPREHENSIVE METABOLIC PANEL
ALT: 12 U/L (ref 0–35)
AST: 17 U/L (ref 0–37)
Albumin: 3.9 g/dL (ref 3.5–5.2)
Alkaline Phosphatase: 93 U/L (ref 39–117)
BUN: 10 mg/dL (ref 6–23)
CHLORIDE: 97 meq/L (ref 96–112)
CO2: 27 mEq/L (ref 19–32)
Calcium: 8.6 mg/dL (ref 8.4–10.5)
Creat: 0.72 mg/dL (ref 0.50–1.10)
GLUCOSE: 80 mg/dL (ref 70–99)
Potassium: 4.6 mEq/L (ref 3.5–5.3)
Sodium: 133 mEq/L — ABNORMAL LOW (ref 135–145)
TOTAL PROTEIN: 6.4 g/dL (ref 6.0–8.3)
Total Bilirubin: 0.5 mg/dL (ref 0.2–1.2)

## 2014-11-13 LAB — LIPID PANEL
CHOL/HDL RATIO: 2.2 ratio
Cholesterol: 215 mg/dL — ABNORMAL HIGH (ref 0–200)
HDL: 99 mg/dL (ref >=46)
LDL CALC: 103 mg/dL — AB (ref 0–99)
TRIGLYCERIDES: 63 mg/dL (ref <150)
VLDL: 13 mg/dL (ref 0–40)

## 2014-12-12 LAB — HM MAMMOGRAPHY

## 2015-02-02 ENCOUNTER — Encounter: Payer: Self-pay | Admitting: *Deleted

## 2015-10-20 ENCOUNTER — Ambulatory Visit (INDEPENDENT_AMBULATORY_CARE_PROVIDER_SITE_OTHER): Payer: Medicare Other | Admitting: Ophthalmology

## 2015-10-27 ENCOUNTER — Ambulatory Visit (INDEPENDENT_AMBULATORY_CARE_PROVIDER_SITE_OTHER): Payer: Medicare Other | Admitting: Ophthalmology

## 2015-10-27 DIAGNOSIS — H43813 Vitreous degeneration, bilateral: Secondary | ICD-10-CM | POA: Diagnosis not present

## 2015-10-27 DIAGNOSIS — H353131 Nonexudative age-related macular degeneration, bilateral, early dry stage: Secondary | ICD-10-CM

## 2015-12-18 LAB — HM MAMMOGRAPHY: HM Mammogram: NORMAL (ref 0–4)

## 2016-01-26 ENCOUNTER — Ambulatory Visit (INDEPENDENT_AMBULATORY_CARE_PROVIDER_SITE_OTHER): Payer: Medicare Other | Admitting: Family Medicine

## 2016-01-26 ENCOUNTER — Encounter: Payer: Self-pay | Admitting: Family Medicine

## 2016-01-26 VITALS — BP 160/84 | HR 72 | Temp 97.6°F | Resp 16 | Ht 63.0 in | Wt 151.4 lb

## 2016-01-26 DIAGNOSIS — E2839 Other primary ovarian failure: Secondary | ICD-10-CM

## 2016-01-26 DIAGNOSIS — H918X2 Other specified hearing loss, left ear: Secondary | ICD-10-CM

## 2016-01-26 DIAGNOSIS — Z86010 Personal history of colonic polyps: Secondary | ICD-10-CM | POA: Diagnosis not present

## 2016-01-26 DIAGNOSIS — H6122 Impacted cerumen, left ear: Secondary | ICD-10-CM

## 2016-01-26 DIAGNOSIS — E785 Hyperlipidemia, unspecified: Secondary | ICD-10-CM | POA: Diagnosis not present

## 2016-01-26 DIAGNOSIS — Z0000 Encounter for general adult medical examination without abnormal findings: Secondary | ICD-10-CM | POA: Diagnosis not present

## 2016-01-26 DIAGNOSIS — M8580 Other specified disorders of bone density and structure, unspecified site: Secondary | ICD-10-CM | POA: Diagnosis not present

## 2016-01-26 DIAGNOSIS — Z23 Encounter for immunization: Secondary | ICD-10-CM

## 2016-01-26 DIAGNOSIS — C4499 Other specified malignant neoplasm of skin, unspecified: Secondary | ICD-10-CM | POA: Diagnosis not present

## 2016-01-26 DIAGNOSIS — Z1159 Encounter for screening for other viral diseases: Secondary | ICD-10-CM

## 2016-01-26 DIAGNOSIS — J302 Other seasonal allergic rhinitis: Secondary | ICD-10-CM | POA: Diagnosis not present

## 2016-01-26 LAB — CBC WITH DIFFERENTIAL/PLATELET
BASOS ABS: 43 {cells}/uL (ref 0–200)
Basophils Relative: 1 %
Eosinophils Absolute: 86 cells/uL (ref 15–500)
Eosinophils Relative: 2 %
HCT: 43.3 % (ref 35.0–45.0)
HEMOGLOBIN: 14.8 g/dL (ref 11.7–15.5)
Lymphocytes Relative: 35 %
Lymphs Abs: 1505 cells/uL (ref 850–3900)
MCH: 31.7 pg (ref 27.0–33.0)
MCHC: 34.2 g/dL (ref 32.0–36.0)
MCV: 92.7 fL (ref 80.0–100.0)
MPV: 10.2 fL (ref 7.5–12.5)
Monocytes Absolute: 473 cells/uL (ref 200–950)
Monocytes Relative: 11 %
NEUTROS PCT: 51 %
Neutro Abs: 2193 cells/uL (ref 1500–7800)
Platelets: 294 10*3/uL (ref 140–400)
RBC: 4.67 MIL/uL (ref 3.80–5.10)
RDW: 13.4 % (ref 11.0–15.0)
WBC: 4.3 10*3/uL (ref 3.8–10.8)

## 2016-01-26 LAB — COMPREHENSIVE METABOLIC PANEL
ALK PHOS: 88 U/L (ref 33–130)
ALT: 15 U/L (ref 6–29)
AST: 21 U/L (ref 10–35)
Albumin: 4.2 g/dL (ref 3.6–5.1)
BUN: 12 mg/dL (ref 7–25)
CO2: 25 mmol/L (ref 20–31)
Calcium: 9.2 mg/dL (ref 8.6–10.4)
Chloride: 95 mmol/L — ABNORMAL LOW (ref 98–110)
Creat: 0.88 mg/dL (ref 0.50–0.99)
GLUCOSE: 93 mg/dL (ref 65–99)
POTASSIUM: 4.2 mmol/L (ref 3.5–5.3)
Sodium: 132 mmol/L — ABNORMAL LOW (ref 135–146)
Total Bilirubin: 0.7 mg/dL (ref 0.2–1.2)
Total Protein: 7.1 g/dL (ref 6.1–8.1)

## 2016-01-26 LAB — POCT URINALYSIS DIP (MANUAL ENTRY)
Bilirubin, UA: NEGATIVE
Blood, UA: NEGATIVE
GLUCOSE UA: NEGATIVE
Ketones, POC UA: NEGATIVE
LEUKOCYTES UA: NEGATIVE
NITRITE UA: NEGATIVE
Protein Ur, POC: NEGATIVE
Spec Grav, UA: 1.01
Urobilinogen, UA: 0.2
pH, UA: 8.5

## 2016-01-26 LAB — LIPID PANEL
Cholesterol: 249 mg/dL — ABNORMAL HIGH (ref 125–200)
HDL: 116 mg/dL (ref >=46)
LDL CALC: 116 mg/dL (ref <130)
Total CHOL/HDL Ratio: 2.1 Ratio (ref <=5.0)
Triglycerides: 84 mg/dL (ref <150)
VLDL: 17 mg/dL (ref <30)

## 2016-01-26 LAB — POC MICROSCOPIC URINALYSIS (UMFC): MUCUS RE: ABSENT

## 2016-01-26 LAB — HEPATITIS C ANTIBODY: HCV AB: NEGATIVE

## 2016-01-26 NOTE — Patient Instructions (Addendum)
   IF you received an x-ray today, you will receive an invoice from [facility]. Please contact [facility] at [phone] with questions or concerns regarding your invoice.   IF you received labwork today, you will receive an invoice from [facility]. Please contact [facility] at [phone] with questions or concerns regarding your invoice.   Our billing staff will not be able to assist you with questions regarding bills from these companies.  You will be contacted with the lab results as soon as they are available. The fastest way to get your results is to activate your My Chart account. Instructions are located on the last page of this paperwork. If you have not heard from us regarding the results in 2 weeks, please contact this office.    Keeping You Healthy  Get These Tests  Blood Pressure- Have your blood pressure checked by your healthcare provider at least once a year.  Normal blood pressure is 120/80.  Weight- Have your body mass index (BMI) calculated to screen for obesity.  BMI is a measure of body fat based on height and weight.  You can calculate your own BMI at www.nhlbisupport.com/bmi/  Cholesterol- Have your cholesterol checked every year.  Diabetes- Have your blood sugar checked every year if you have high blood pressure, high cholesterol, a family history of diabetes or if you are overweight.  Pap Test - Have a pap test every 1 to 5 years if you have been sexually active.  If you are older than 65 and recent pap tests have been normal you may not need additional pap tests.  In addition, if you have had a hysterectomy  for benign disease additional pap tests are not necessary.  Mammogram-Yearly mammograms are essential for early detection of breast cancer  Screening for Colon Cancer- Colonoscopy starting at age 50. Screening may begin sooner depending on your family history and other health conditions.  Follow up colonoscopy  as directed by your Gastroenterologist.  Screening for Osteoporosis- Screening begins at age 65 with bone density scanning, sooner if you are at higher risk for developing Osteoporosis.  Get these medicines  Calcium with Vitamin D- Your body requires 1200-1500 mg of Calcium a day and 800-1000 IU of Vitamin D a day.  You can only absorb 500 mg of Calcium at a time therefore Calcium must be taken in 2 or 3 separate doses throughout the day.  Hormones- Hormone therapy has been associated with increased risk for certain cancers and heart disease.  Talk to your healthcare provider about if you need relief from menopausal symptoms.  Aspirin- Ask your healthcare provider about taking Aspirin to prevent Heart Disease and Stroke.  Get these Immuniztions  Flu shot- Every fall  Pneumonia shot- Once after the age of 65; if you are younger ask your healthcare provider if you need a pneumonia shot.  Tetanus- Every ten years.  Zostavax- Once after the age of 60 to prevent shingles.  Take these steps  Don't smoke- Your healthcare provider can help you quit. For tips on how to quit, ask your healthcare provider or go to www.smokefree.gov or call 1-800 QUIT-NOW.  Be physically active- Exercise 5 days a week for a minimum of 30 minutes.  If you are not already physically active, start slow and gradually work up to 30 minutes of moderate physical activity.  Try walking, dancing, bike riding, swimming, etc.  Eat a healthy diet- Eat a variety of healthy foods such as fruits, vegetables, whole   grains, low fat milk, low fat cheeses, yogurt, lean meats, chicken, fish, eggs, dried beans, tofu, etc.  For more information go to www.thenutritionsource.org  Dental visit- Brush and floss teeth twice daily; visit your dentist twice a year.  Eye exam- Visit your Optometrist or Ophthalmologist yearly.  Drink alcohol in moderation- Limit alcohol intake to one drink or less a day.  Never drink and  drive.  Depression- Your emotional health is as important as your physical health.  If you're feeling down or losing interest in things you normally enjoy, please talk to your healthcare provider.  Seat Belts- can save your life; always wear one  Smoke/Carbon Monoxide detectors- These detectors need to be installed on the appropriate level of your home.  Replace batteries at least once a year.  Violence- If anyone is threatening or hurting you, please tell your healthcare provider.  Living Will/ Health care power of attorney- Discuss with your healthcare provider and family. 

## 2016-01-26 NOTE — Progress Notes (Signed)
Subjective:    Patient ID: Candice Saunders, female    DOB: 08-11-1950, 65 y.o.   MRN: [id number]  01/26/2016  Annual Exam and Cerumen Impaction (L ear)   HPI This 65 y.o. female presents for Annual Wellness Examination.  Last physical:  11-12-2014 Pap smear:  10-15-2012 Mammogram:  12-12-2014; [facility].   Colonoscopy:  11/11-06-2014 Bone density:  11-18-13 TDAP:  2014 Pneumovax:  [id number] 2015; due for Pneumovax. Zostavax:  2012 Influenza:  03/03/2014; 2016 Eye exam:  [name]; retinal exam; good results/no change; mild macular degeneration; annual exam.   Dental exam:  Every 3 months.   Paget's disease: followed 09/10/15; has new colorectal surgeon due to surgeon moving to [place].  Followed every six months.  Continue to follow up every six months.  No itching or rash issues.  Large scar.    Review of Systems  Constitutional: Negative for fever, chills, diaphoresis, activity change, appetite change, fatigue and unexpected weight change.  HENT: Negative for congestion, dental problem, drooling, ear discharge, ear pain, facial swelling, hearing loss, mouth sores, nosebleeds, postnasal drip, rhinorrhea, sinus pressure, sneezing, sore throat, tinnitus, trouble swallowing and voice change.   Eyes: Negative for photophobia, pain, discharge, redness, itching and visual disturbance.  Respiratory: Negative for apnea, cough, choking, chest tightness, shortness of breath, wheezing and stridor.   Cardiovascular: Negative for chest pain, palpitations and leg swelling.  Gastrointestinal: Negative for nausea, vomiting, abdominal pain, diarrhea, constipation, blood in stool, abdominal distention, anal bleeding and rectal pain.  Endocrine: Negative for cold intolerance, heat intolerance, polydipsia, polyphagia and polyuria.  Genitourinary: Negative for dysuria, urgency, frequency, hematuria, flank pain, decreased urine volume, vaginal bleeding, vaginal discharge, enuresis, difficulty urinating,  genital sores, vaginal pain, menstrual problem, pelvic pain and dyspareunia.  Musculoskeletal: Negative for myalgias, back pain, joint swelling, arthralgias, gait problem, neck pain and neck stiffness.  Skin: Negative for color change, pallor, rash and wound.  Allergic/Immunologic: Negative for environmental allergies, food allergies and immunocompromised state.  Neurological: Negative for dizziness, tremors, seizures, syncope, facial asymmetry, speech difficulty, weakness, light-headedness, numbness and headaches.  Hematological: Negative for adenopathy. Does not bruise/bleed easily.  Psychiatric/Behavioral: Negative for suicidal ideas, hallucinations, behavioral problems, confusion, sleep disturbance, self-injury, dysphoric mood, decreased concentration and agitation. The patient is not nervous/anxious and is not hyperactive.     Past Medical History:  Diagnosis Date  . Allergy   . Depression   . Diverticulosis   . Extra mammary Paget disease   . 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 Hx of colonic polyps 09/29/2011  . Hyperlipidemia   . Macular degeneration 10/09/2012   [facility]/Retinal specialist  . Lebron Quam 09/29/2011  . Osteoporosis    Past Surgical History:  Procedure Laterality Date  . Admission  07/11/2002   GI bleeding due to polyp.    . COLONOSCOPY W/ POLYPECTOMY  11/09/2010   [facility]; every 2-3 years  . extramammary paget's resection perianal region 07/06/2014 [facility] Colorectal surgery/[facility]  07/06/2014  . EYE SURGERY     Cataract resection  . ROTATOR CUFF REPAIR  01/08/2005   Right   No Known Allergies Current Outpatient Prescriptions  Medication Sig Dispense Refill  . aspirin 81 MG tablet Take 81 mg by mouth daily.    . Multiple Vitamins-Minerals (PRESERVISION AREDS 2 PO) Take by mouth.     No current facility-administered medications for this visit.    Social History   Social History    . Marital status:  Single    Spouse name: N/A  . Number of children: 0  . Years of education: N/A   Occupational History  . retired     Teaching laboratory technician; retired in 2004.   Social History Main Topics  . Smoking status: Former Smoker    Years: 30.00    Types: Cigarettes    Quit date: 08/26/2006  . Smokeless tobacco: Never Used  . Alcohol use 10.8 oz/week    18 Cans of beer per week     Comment: drinks 2 beers daily; 4 beers on Friday and Saturdays.  . Drug use: No  . Sexual activity: Not Currently    Partners: Female     Comment: Same sex sexual partners   Other Topics Concern  . Not on file   Social History Narrative   Marital status: single.  Dating x casually/seriously.   Seeing once weekly.      Children: none     Lives: with ex-partner since 2012; moving out in 10/2012.Lives in [place] in home.        Employment: retired since 2004; school psychologist x 30 years.        Tobacco: quit smoking 2004.       Alcohol:  2 beers per day; more on weekends (14 beers per weekend night).        Drugs: none      Exercise: hiking a lot in 2017; has hiking sticks.        Seatbelt: 100%; no texting while driving. Flip phone in 2017.      Sunscreen:  Sporadic.      Guns: none.      Sexual activity: same sex partners.      ADLs: independent life;       Advanced Directive:  Yes; Animal nutritionist.   FULL CODE.                       Family History  Problem Relation Age of Onset  . Alzheimer's disease Mother   . Cancer Mother 96    Breast cancer  . Osteoporosis Mother   . Stroke Mother   . Heart disease Father 49    AMI, CHF  . Kidney disease Father 68    ESRD/HD  . Hyperlipidemia Father   . Hypertension Father   . Stroke Brother 73    TIA  . Hyperlipidemia Sister   . Hypertension Sister   . Cancer Sister 17    oral cancer       Objective:    BP (!) 160/84 (BP Location: Right Arm, Cuff Size: Normal)   Pulse 72   Temp 97.6 F (36.4 C) (Oral)   Resp  16   Ht 5' 3"  (1.6 m)   Wt 151 lb 6.4 oz (68.7 kg)   SpO2 96%   BMI 26.82 kg/m  Physical Exam  Constitutional: She is oriented to person, place, and time. She appears well-developed and well-nourished. No distress.  HENT:  Head: Normocephalic and atraumatic.  Right Ear: External ear normal.  Left Ear: External ear normal.  Nose: Nose normal.  Mouth/Throat: Oropharynx is clear and moist.  Eyes: Conjunctivae and EOM are normal. Pupils are equal, round, and reactive to light.  Neck: Normal range of motion and full passive range of motion without pain. Neck supple. No JVD present. Carotid bruit is not present. No thyromegaly present.  Cardiovascular: Normal rate, regular rhythm and normal heart sounds.  Exam reveals no gallop and  no friction rub.   No murmur heard. Pulmonary/Chest: Effort normal and breath sounds normal. She has no wheezes. She has no rales. Right breast exhibits no inverted nipple, no mass, no nipple discharge, no skin change and no tenderness. Left breast exhibits no inverted nipple, no mass, no nipple discharge, no skin change and no tenderness. Breasts are symmetrical.  Abdominal: Soft. Bowel sounds are normal. She exhibits no distension and no mass. There is no tenderness. There is no rebound and no guarding.  Musculoskeletal:       Right shoulder: Normal.       Left shoulder: Normal.       Cervical back: Normal.  Lymphadenopathy:    She has no cervical adenopathy.  Neurological: She is alert and oriented to person, place, and time. She has normal reflexes. No cranial nerve deficit. She exhibits normal muscle tone. Coordination normal.  Skin: Skin is warm and dry. No rash noted. She is not diaphoretic. No erythema. No pallor.  Psychiatric: She has a normal mood and affect. Her behavior is normal. Judgment and thought content normal.  Nursing note and vitals reviewed.  Results for orders placed or performed in visit on 01/26/16  CBC with Differential/Platelet  Result  Value Ref Range   WBC 4.3 3.8 - 10.8 K/uL   RBC 4.67 3.80 - 5.10 MIL/uL   Hemoglobin 14.8 11.7 - 15.5 g/dL   HCT 43.3 35.0 - 45.0 %   MCV 92.7 80.0 - 100.0 fL   MCH 31.7 27.0 - 33.0 pg   MCHC 34.2 32.0 - 36.0 g/dL   RDW 13.4 11.0 - 15.0 %   Platelets 294 140 - 400 K/uL   MPV 10.2 7.5 - 12.5 fL   Neutro Abs 2,193 1,500 - 7,800 cells/uL   Lymphs Abs 1,505 850 - 3,900 cells/uL   Monocytes Absolute 473 200 - 950 cells/uL   Eosinophils Absolute 86 15 - 500 cells/uL   Basophils Absolute 43 0 - 200 cells/uL   Neutrophils Relative % 51 %   Lymphocytes Relative 35 %   Monocytes Relative 11 %   Eosinophils Relative 2 %   Basophils Relative 1 %   Smear Review Criteria for review not met   Comprehensive metabolic panel  Result Value Ref Range   Sodium 132 (L) 135 - 146 mmol/L   Potassium 4.2 3.5 - 5.3 mmol/L   Chloride 95 (L) 98 - 110 mmol/L   CO2 25 20 - 31 mmol/L   Glucose, Bld 93 65 - 99 mg/dL   BUN 12 7 - 25 mg/dL   Creat 0.88 0.50 - 0.99 mg/dL   Total Bilirubin 0.7 0.2 - 1.2 mg/dL   Alkaline Phosphatase 88 33 - 130 U/L   AST 21 10 - 35 U/L   ALT 15 6 - 29 U/L   Total Protein 7.1 6.1 - 8.1 g/dL   Albumin 4.2 3.6 - 5.1 g/dL   Calcium 9.2 8.6 - 10.4 mg/dL  Lipid panel  Result Value Ref Range   Cholesterol 249 (H) 125 - 200 mg/dL   Triglycerides 84 <150 mg/dL   HDL 116 >=46 mg/dL   Total CHOL/HDL Ratio 2.1 <=5.0 Ratio   VLDL 17 <30 mg/dL   LDL Cholesterol 116 <130 mg/dL  VITAMIN D 25 Hydroxy (Vit-D Deficiency, Fractures)  Result Value Ref Range   Vit D, 25-Hydroxy 28 (L) 30 - 100 ng/mL  Hepatitis C antibody  Result Value Ref Range   HCV Ab NEGATIVE NEGATIVE  POCT urinalysis dipstick  Result Value Ref Range   Color, UA yellow yellow   Clarity, UA clear clear   Glucose, UA negative negative   Bilirubin, UA negative negative   Ketones, POC UA negative negative   Spec Grav, UA 1.010    Blood, UA negative negative   pH, UA 8.5    Protein Ur, POC negative negative    Urobilinogen, UA 0.2    Nitrite, UA Negative Negative   Leukocytes, UA Negative Negative  POCT Microscopic Urinalysis ([facility])  Result Value Ref Range   WBC,UR,HPF,POC None None WBC/hpf   RBC,UR,HPF,POC None None RBC/hpf   Bacteria None None, Too numerous to count   Mucus Absent Absent   Epithelial Cells, UR Per Microscopy None None, Too numerous to count cells/hpf   Depression screen [facility] 2/9 01/26/2016 11/12/2014 05/19/2014 12/11/2013  Decreased Interest 0 0 0 0  Down, Depressed, Hopeless 0 0 0 0  PHQ - 2 Score 0 0 0 0   Fall Risk  01/26/2016 11/12/2014 05/19/2014 12/11/2013  Falls in the past year? No No No Yes  Number falls in past yr: - - - 1  Injury with Fall? - - - No  Functional Status Survey: Is the patient deaf or have difficulty hearing?: No Does the patient have difficulty seeing, even when wearing glasses/contacts?: No Does the patient have difficulty concentrating, remembering, or making decisions?: No Does the patient have difficulty walking or climbing stairs?: No Does the patient have difficulty dressing or bathing?: No Does the patient have difficulty doing errands alone such as visiting a doctor's office or shopping?: No     Assessment & Plan:   1. Encounter for Medicare annual wellness exam   2. Extra mammary Paget disease   3. Hx of colonic polyps   4. Osteopenia   5. Dyslipidemia   6. Seasonal allergies   7. Hearing loss of left ear due to cerumen impaction   8. Need for pneumococcal vaccination   9. Estrogen deficiency   10. Need for hepatitis C screening test     Orders Placed This Encounter  Procedures  . DG Bone Density    Standing Status:   Future    Standing Expiration Date:   03/28/2017    Scheduling Instructions:     [facility]    Order Specific Question:   Reason for Exam (SYMPTOM  OR DIAGNOSIS REQUIRED)    Answer:   estrogen deficiency    Order Specific Question:   Preferred imaging location?    Answer:   External  . Pneumococcal polysaccharide vaccine  23-valent greater than or equal to 2yo subcutaneous/IM  . CBC with Differential/Platelet  . Comprehensive metabolic panel    Order Specific Question:   Has the patient fasted?    Answer:   Yes  . Lipid panel    Order Specific Question:   Has the patient fasted?    Answer:   Yes  . VITAMIN D 25 Hydroxy (Vit-D Deficiency, Fractures)  . Hepatitis C antibody  . Ear wax removal    Scheduling Instructions:     Bilateral (LEFT> RIGHT)  . POCT urinalysis dipstick  . POCT Microscopic Urinalysis ([facility])   No orders of the defined types were placed in this encounter.   Return in about 1 year (around 01/25/2017) for complete physical examiniation.    Becket Wecker Elayne Guerin, M.D. [facility] [address] [place], [place]  [address] [phone] phone [phone] fax

## 2016-01-27 LAB — VITAMIN D 25 HYDROXY (VIT D DEFICIENCY, FRACTURES): VIT D 25 HYDROXY: 28 ng/mL — AB (ref 30–100)

## 2016-10-27 ENCOUNTER — Ambulatory Visit (INDEPENDENT_AMBULATORY_CARE_PROVIDER_SITE_OTHER): Payer: Medicare Other | Admitting: Ophthalmology

## 2016-10-27 DIAGNOSIS — H353122 Nonexudative age-related macular degeneration, left eye, intermediate dry stage: Secondary | ICD-10-CM | POA: Diagnosis not present

## 2016-10-27 DIAGNOSIS — H43813 Vitreous degeneration, bilateral: Secondary | ICD-10-CM | POA: Diagnosis not present

## 2016-10-27 DIAGNOSIS — H353111 Nonexudative age-related macular degeneration, right eye, early dry stage: Secondary | ICD-10-CM

## 2017-01-02 LAB — HM MAMMOGRAPHY

## 2017-02-22 ENCOUNTER — Encounter: Payer: Self-pay | Admitting: Family Medicine

## 2017-03-21 ENCOUNTER — Encounter: Payer: Self-pay | Admitting: Family Medicine

## 2017-03-21 ENCOUNTER — Ambulatory Visit (INDEPENDENT_AMBULATORY_CARE_PROVIDER_SITE_OTHER): Payer: Medicare Other | Admitting: Family Medicine

## 2017-03-21 VITALS — BP 126/80 | HR 78 | Temp 98.0°F | Resp 16 | Ht 62.99 in | Wt 153.0 lb

## 2017-03-21 DIAGNOSIS — Z23 Encounter for immunization: Secondary | ICD-10-CM | POA: Diagnosis not present

## 2017-03-21 DIAGNOSIS — Z87891 Personal history of nicotine dependence: Secondary | ICD-10-CM

## 2017-03-21 DIAGNOSIS — Z86010 Personal history of colonic polyps: Secondary | ICD-10-CM | POA: Diagnosis not present

## 2017-03-21 DIAGNOSIS — Z720 Tobacco use: Secondary | ICD-10-CM

## 2017-03-21 DIAGNOSIS — Z0000 Encounter for general adult medical examination without abnormal findings: Secondary | ICD-10-CM

## 2017-03-21 DIAGNOSIS — E785 Hyperlipidemia, unspecified: Secondary | ICD-10-CM

## 2017-03-21 DIAGNOSIS — M810 Age-related osteoporosis without current pathological fracture: Secondary | ICD-10-CM

## 2017-03-21 MED ORDER — ZOSTER VAC RECOMB ADJUVANTED 50 MCG/0.5ML IM SUSR: 0.5000 mL | Freq: Once | INTRAMUSCULAR | 1 refills | Status: AC

## 2017-03-21 NOTE — Patient Instructions (Addendum)
   IF you received an x-ray today, you will receive an invoice from [facility]. Please contact [facility] at [phone] with questions or concerns regarding your invoice.   IF you received labwork today, you will receive an invoice from [facility]. Please contact [facility] at [phone] with questions or concerns regarding your invoice.   Our billing staff will not be able to assist you with questions regarding bills from these companies.  You will be contacted with the lab results as soon as they are available. The fastest way to get your results is to activate your My Chart account. Instructions are located on the last page of this paperwork. If you have not heard from us regarding the results in 2 weeks, please contact this office.      Preventive Care 65 Years and Older, Female Preventive care refers to lifestyle choices and visits with your health care provider that can promote health and wellness. What does preventive care include?  A yearly physical exam. This is also called an annual well check.  Dental exams once or twice a year.  Routine eye exams. Ask your health care provider how often you should have your eyes checked.  Personal lifestyle choices, including: ? Daily care of your teeth and gums. ? Regular physical activity. ? Eating a healthy diet. ? Avoiding tobacco and drug use. ? Limiting alcohol use. ? Practicing safe sex. ? Taking low-dose aspirin every day. ? Taking vitamin and mineral supplements as recommended by your health care provider. What happens during an annual well check? The services and screenings done by your health care provider during your annual well check will depend on your age, overall health, lifestyle risk factors, and family history of disease. Counseling Your health care provider may ask you questions about your:  Alcohol use.  Tobacco use.  Drug use.  Emotional well-being.  Home and relationship  well-being.  Sexual activity.  Eating habits.  History of falls.  Memory and ability to understand (cognition).  Work and work environment.  Reproductive health.  Screening You may have the following tests or measurements:  Height, weight, and BMI.  Blood pressure.  Lipid and cholesterol levels. These may be checked every 5 years, or more frequently if you are over 50 years old.  Skin check.  Lung cancer screening. You may have this screening every year starting at age 55 if you have a 30-pack-year history of smoking and currently smoke or have quit within the past 15 years.  Fecal occult blood test (FOBT) of the stool. You may have this test every year starting at age 50.  Flexible sigmoidoscopy or colonoscopy. You may have a sigmoidoscopy every 5 years or a colonoscopy every 10 years starting at age 50.  Hepatitis C blood test.  Hepatitis B blood test.  Sexually transmitted disease (STD) testing.  Diabetes screening. This is done by checking your blood sugar (glucose) after you have not eaten for a while (fasting). You may have this done every 1-3 years.  Bone density scan. This is done to screen for osteoporosis. You may have this done starting at age 65.  Mammogram. This may be done every 1-2 years. Talk to your health care provider about how often you should have regular mammograms.  Talk with your health care provider about your test results, treatment options, and if necessary, the need for more tests. Vaccines Your health care provider may recommend certain vaccines, such as:  Influenza vaccine. This is recommended every year.    Tetanus, diphtheria, and acellular pertussis (Tdap, Td) vaccine. You may need a Td booster every 10 years.  Varicella vaccine. You may need this if you have not been vaccinated.  Zoster vaccine. You may need this after age 60.  Measles, mumps, and rubella (MMR) vaccine. You may need at least one dose of MMR if you were born in  1957 or later. You may also need a second dose.  Pneumococcal 13-valent conjugate (PCV13) vaccine. One dose is recommended after age 65.  Pneumococcal polysaccharide (PPSV23) vaccine. One dose is recommended after age 65.  Meningococcal vaccine. You may need this if you have certain conditions.  Hepatitis A vaccine. You may need this if you have certain conditions or if you travel or work in places where you may be exposed to hepatitis A.  Hepatitis B vaccine. You may need this if you have certain conditions or if you travel or work in places where you may be exposed to hepatitis B.  Haemophilus influenzae type b (Hib) vaccine. You may need this if you have certain conditions.  Talk to your health care provider about which screenings and vaccines you need and how often you need them. This information is not intended to replace advice given to you by your health care provider. Make sure you discuss any questions you have with your health care provider. Document Released: 07/24/2015 Document Revised: 03/16/2016 Document Reviewed: 04/28/2015 Elsevier Interactive Patient Education  2017 [employer] Inc[employer]  

## 2017-03-21 NOTE — Progress Notes (Signed)
   Subjective:    Patient ID: Candice Saunders, female    DOB: [birth year], 66 y.o.   MRN: [id number]  HPI    Review of Systems  Musculoskeletal: Positive for neck pain and neck stiffness.       Objective:   Physical Exam        Assessment & Plan:

## 2017-03-21 NOTE — Progress Notes (Signed)
Subjective:    Patient ID: Candice Saunders, female    DOB: 06-13-1951, 66 y.o.   MRN: [id number]  03/21/2017  Medicare Wellness   HPI This 66 y.o. female presents for Annual wellness Examination and follow-up of chronic medical conditions.  Last physical:01-26-2016 Pap smear: n/a Mammogram: 12-2016 Colonoscopy: 2015 Bone density: 2015 Eye exam:  Every spring 2018; retina check up with [name].  No change.  Touch of macular degeneration. Dental exam:  Every six months.   Visual Acuity Screening   Right eye Left eye Both eyes  Without correction: 20/25 20/25 20/20   With correction:      Had shingles after vaccine.  BP Readings from Last 3 Encounters:  03/21/17 126/80  01/26/16 (!) 160/84  11/12/14 126/73   Wt Readings from Last 3 Encounters:  03/21/17 153 lb (69.4 kg)  01/26/16 151 lb 6.4 oz (68.7 kg)  11/12/14 149 lb 9.6 oz (67.9 kg)   Immunization History  Administered Date(s) Administered  . Influenza,inj,Quad PF,6+ Mos 03/03/2014, 03/21/2017  . Influenza-Unspecified 04/10/2013  . Pneumococcal Conjugate-13 05/19/2014  . Pneumococcal Polysaccharide-23 01/26/2016  . Tdap 10/15/2012  . 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  Tobacco abuse hx:  Quit smoking 7 years ago; smoked for 30+ years.  Desires screening low dose CT.   Review of Systems  Constitutional: Negative for activity change, appetite change, chills, diaphoresis, fatigue, fever and unexpected weight change.  HENT: Negative for congestion, dental problem, drooling, ear discharge, ear pain, facial swelling, hearing loss, mouth sores, nosebleeds, postnasal drip, rhinorrhea, sinus pressure, sneezing, sore throat, tinnitus, trouble swallowing and voice change.   Eyes: Negative for photophobia, pain, discharge, redness, itching and visual disturbance.  Respiratory: Negative for apnea, cough, choking, chest tightness,  shortness of breath, wheezing and stridor.   Cardiovascular: Negative for chest pain, palpitations and leg swelling.  Gastrointestinal: Negative for abdominal distention, abdominal pain, anal bleeding, blood in stool, constipation, diarrhea, nausea, rectal pain and vomiting.  Endocrine: Negative for cold intolerance, heat intolerance, polydipsia, polyphagia and polyuria.  Genitourinary: Negative for decreased urine volume, difficulty urinating, dyspareunia, dysuria, enuresis, flank pain, frequency, genital sores, hematuria, menstrual problem, pelvic pain, urgency, vaginal bleeding, vaginal discharge and vaginal pain.       Nocturia x 1.  Urinary leakage none.  Musculoskeletal: Negative for arthralgias, back pain, gait problem, joint swelling, myalgias, neck pain and neck stiffness.  Skin: Negative for color change, pallor, rash and wound.  Allergic/Immunologic: Negative for environmental allergies, food allergies and immunocompromised state.  Neurological: Negative for dizziness, tremors, seizures, syncope, facial asymmetry, speech difficulty, weakness, light-headedness, numbness and headaches.  Hematological: Negative for adenopathy. Does not bruise/bleed easily.  Psychiatric/Behavioral: Negative for agitation, behavioral problems, confusion, decreased concentration, dysphoric mood, hallucinations, self-injury, sleep disturbance and suicidal ideas. The patient is not nervous/anxious and is not hyperactive.        Bedtime 900pm; wakes up 0600am.    Past Medical History:  Diagnosis Date  . Allergy   . Depression   . Diverticulosis   . Extra mammary Paget disease   . 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 Hx of colonic polyps 09/29/2011  . Hyperlipidemia   . Macular degeneration 10/09/2012   John Matthews/Retinal specialist  . Lebron Quam 09/29/2011  . Osteoporosis    Past Surgical History:  Procedure Laterality Date    . Admission  07/11/2002  GI bleeding due to polyp.    . COLONOSCOPY W/ POLYPECTOMY  11/09/2010   Buccini; every 2-3 years  . extramammary paget's resection perianal region 07/06/2014 [facility] Colorectal surgery/Hopkins  07/06/2014  . EYE SURGERY     Cataract resection  . ROTATOR CUFF REPAIR  01/08/2005   Right   No Known Allergies Current Outpatient Prescriptions  Medication Sig Dispense Refill  . aspirin 81 MG tablet Take 81 mg by mouth daily.    . Cholecalciferol (VITAMIN D3) 3000 units TABS Take by mouth.    . Multiple Vitamins-Minerals (PRESERVISION AREDS 2 PO) Take by mouth.     No current facility-administered medications for this visit.    Social History   Social History  . Marital status: Single    Spouse name: N/A  . Number of children: 0  . Years of education: N/A   Occupational History  . retired     Teaching laboratory technician; retired in 2004.   Social History Main Topics  . Smoking status: Former Smoker    Years: 30.00    Types: Cigarettes    Quit date: 08/26/2006  . Smokeless tobacco: Never Used  . Alcohol use 10.8 oz/week    18 Cans of beer per week     Comment: drinks 2 beers daily; 4 beers on Friday and Saturdays.  . Drug use: No  . Sexual activity: Not Currently    Partners: Female     Comment: Same sex sexual partners   Other Topics Concern  . Not on file   Social History Narrative   Marital status: single.  Dating x casually/seriously.   Seeing once weekly.      Children: none     Lives: with ex-partner since 2012; moving out in 10/2012.Lives alone in [place] in home.          Employment: retired since 2004; school psychologist x 30 years.        Tobacco: quit smoking 2004.       Alcohol:  beer on weekends.      Drugs: none      Exercise: no  Walking or hiking in 2018. Goal is 10,000 steps per day in 2018.      Seatbelt: 100%; no texting while driving. Flip phone in 2017.      Sunscreen:  Sporadic.      Guns: none.      Sexual activity: same sex partners.       ADLs: independent life; drives.      Advanced Directive:  Yes; Animal nutritionist.   FULL CODE.                       Family History  Problem Relation Age of Onset  . Alzheimer's disease Mother   . Cancer Mother 57       Breast cancer  . Osteoporosis Mother   . Stroke Mother   . Heart disease Father 21       AMI, CHF  . Kidney disease Father 65       ESRD/HD  . Hyperlipidemia Father   . Hypertension Father   . Stroke Brother 73       TIA  . Hyperlipidemia Sister   . Hypertension Sister   . Cancer Sister 11       oral cancer       Objective:    BP 126/80   Pulse 78   Temp 98 F (36.7 C) (Oral)   Resp 16  Ht 5' 2.99" (1.6 m)   Wt 153 lb (69.4 kg)   SpO2 98%   BMI 27.11 kg/m  Physical Exam  Constitutional: She is oriented to person, place, and time. She appears well-developed and well-nourished. No distress.  HENT:  Head: Normocephalic and atraumatic.  Right Ear: External ear normal.  Left Ear: External ear normal.  Nose: Nose normal.  Mouth/Throat: Oropharynx is clear and moist.  Eyes: Pupils are equal, round, and reactive to light. Conjunctivae and EOM are normal.  Neck: Normal range of motion and full passive range of motion without pain. Neck supple. No JVD present. Carotid bruit is not present. No thyromegaly present.  Cardiovascular: Normal rate, regular rhythm and normal heart sounds.  Exam reveals no gallop and no friction rub.   No murmur heard. Pulmonary/Chest: Effort normal and breath sounds normal. She has no wheezes. She has no rales. Right breast exhibits no inverted nipple, no mass, no nipple discharge, no skin change and no tenderness. Left breast exhibits no inverted nipple, no mass, no nipple discharge, no skin change and no tenderness. Breasts are symmetrical.  Abdominal: Soft. Bowel sounds are normal. She exhibits no distension and no mass. There is no tenderness. There is no rebound and no guarding.  Musculoskeletal:        Right shoulder: Normal.       Left shoulder: Normal.       Cervical back: Normal.  Lymphadenopathy:    She has no cervical adenopathy.  Neurological: She is alert and oriented to person, place, and time. She has normal reflexes. No cranial nerve deficit. She exhibits normal muscle tone. Coordination normal.  Skin: Skin is warm and dry. No rash noted. She is not diaphoretic. No erythema. No pallor.  Psychiatric: She has a normal mood and affect. Her behavior is normal. Judgment and thought content normal.  Nursing note and vitals reviewed.   No results found. Depression screen [facility] 2/9 03/21/2017 01/26/2016 11/12/2014 05/19/2014 12/11/2013  Decreased Interest 0 0 0 0 0  Down, Depressed, Hopeless 0 0 0 0 0  PHQ - 2 Score 0 0 0 0 0   Fall Risk  03/21/2017 01/26/2016 11/12/2014 05/19/2014 12/11/2013  Falls in the past year? No No No No Yes  Number falls in past yr: - - - - 1  Injury with Fall? - - - - No   Functional Status Survey: Is the patient deaf or have difficulty hearing?: No Does the patient have difficulty seeing, even when wearing glasses/contacts?: No Does the patient have difficulty concentrating, remembering, or making decisions?: No Does the patient have difficulty walking or climbing stairs?: No Does the patient have difficulty dressing or bathing?: No Does the patient have difficulty doing errands alone such as visiting a doctor's office or shopping?: No      Assessment & Plan:   1. Medicare annual wellness visit, subsequent   2. Routine physical examination   3. Need for prophylactic vaccination and inoculation against influenza   4. History of smoking   5. Tobacco use   6. Age-related osteoporosis without current pathological fracture   7. Dyslipidemia   8. Hx of colonic polyps    -moderate fall risk; no evidence of depression; no evidence of hearing loss.  Discussed advanced directives and living will; also discussed end of life issues including code status.  -independent  with ADLs. -obtain labs for chronic disease management.   -hx of tobacco abuse for thirty years; thus, obtain CT chest lung low dose. -followed  closely for extramammary Page'ts disease every six months. No recurrence.    Orders Placed This Encounter  Procedures  . CT CHEST LUNG CA SCREEN LOW DOSE W/O CM    Standing Status:   Future    Standing Expiration Date:   05/21/2018    Order Specific Question:   Reason for Exam (SYMPTOM  OR DIAGNOSIS REQUIRED)    Answer:   smoking history x 30 years    Order Specific Question:   Preferred Imaging Location?    Answer:   [facility]    Order Specific Question:   Preferred imaging location?    Answer:   [facility]    Order Specific Question:   Radiology Contrast Protocol - do NOT remove file path    Answer:   \\[email or web address].[email or web address]  . Flu Vaccine QUAD 36+ mos IM  . CBC with Differential/Platelet  . Comprehensive metabolic panel    Order Specific Question:   Has the patient fasted?    Answer:   Yes  . Lipid panel    Order Specific Question:   Has the patient fasted?    Answer:   Yes   Meds ordered this encounter  Medications  . Cholecalciferol (VITAMIN D3) 3000 units TABS    Sig: Take by mouth.  . Zoster Vac Recomb Adjuvanted [facility]) injection    Sig: Inject 0.5 mLs into the muscle once.    Dispense:  0.5 mL    Refill:  1    Return in about 1 year (around 03/21/2018) for complete physical examiniation.   Edilson Vital Elayne Guerin, M.D. Primary Care at [facility] previously [facility] [place] [address] [place],   [address] [phone] phone [phone] fax

## 2017-03-22 LAB — LIPID PANEL
CHOL/HDL RATIO: 2.5 ratio (ref 0.0–4.4)
Cholesterol, Total: 240 mg/dL — ABNORMAL HIGH (ref 100–199)
HDL: 95 mg/dL (ref >39)
LDL Calculated: 132 mg/dL — ABNORMAL HIGH (ref 0–99)
TRIGLYCERIDES: 67 mg/dL (ref 0–149)
VLDL Cholesterol Cal: 13 mg/dL (ref 5–40)

## 2017-03-22 LAB — CBC WITH DIFFERENTIAL/PLATELET
BASOS ABS: 0 10*3/uL (ref 0.0–0.2)
Basos: 1 %
EOS (ABSOLUTE): 0.1 10*3/uL (ref 0.0–0.4)
Eos: 2 %
Hematocrit: 41.8 % (ref 34.0–46.6)
Hemoglobin: 14.3 g/dL (ref 11.1–15.9)
IMMATURE GRANS (ABS): 0 10*3/uL (ref 0.0–0.1)
Immature Granulocytes: 0 %
LYMPHS: 30 %
Lymphocytes Absolute: 1.4 10*3/uL (ref 0.7–3.1)
MCH: 31.5 pg (ref 26.6–33.0)
MCHC: 34.2 g/dL (ref 31.5–35.7)
MCV: 92 fL (ref 79–97)
MONOS ABS: 0.4 10*3/uL (ref 0.1–0.9)
Monocytes: 9 %
NEUTROS ABS: 2.7 10*3/uL (ref 1.4–7.0)
NEUTROS PCT: 58 %
PLATELETS: 329 10*3/uL (ref 150–379)
RBC: 4.54 x10E6/uL (ref 3.77–5.28)
RDW: 13.5 % (ref 12.3–15.4)
WBC: 4.6 10*3/uL (ref 3.4–10.8)

## 2017-03-22 LAB — COMPREHENSIVE METABOLIC PANEL
ALT: 15 IU/L (ref 0–32)
AST: 19 IU/L (ref 0–40)
Albumin/Globulin Ratio: 1.9 (ref 1.2–2.2)
Albumin: 4.5 g/dL (ref 3.6–4.8)
Alkaline Phosphatase: 94 IU/L (ref 39–117)
BUN / CREAT RATIO: 17 (ref 12–28)
BUN: 13 mg/dL (ref 8–27)
Bilirubin Total: 0.3 mg/dL (ref 0.0–1.2)
CO2: 23 mmol/L (ref 20–29)
Calcium: 9.5 mg/dL (ref 8.7–10.3)
Chloride: 94 mmol/L — ABNORMAL LOW (ref 96–106)
Creatinine, Ser: 0.77 mg/dL (ref 0.57–1.00)
GFR calc Af Amer: 92 mL/min/{1.73_m2} (ref >59)
GFR calc non Af Amer: 80 mL/min/{1.73_m2} (ref >59)
GLUCOSE: 93 mg/dL (ref 65–99)
Globulin, Total: 2.4 g/dL (ref 1.5–4.5)
POTASSIUM: 4.4 mmol/L (ref 3.5–5.2)
SODIUM: 134 mmol/L (ref 134–144)
Total Protein: 6.9 g/dL (ref 6.0–8.5)

## 2017-03-24 NOTE — Progress Notes (Signed)
Subjective:    Patient ID: Candice Saunders, female    DOB: 07/09/1951, 66 y.o.   MRN: [id number]  03/21/2017  Medicare Wellness   HPI This 66 y.o. female presents for ROUTINE PHYSICAL EXAMINATION.   BP Readings from Last 3 Encounters:  03/21/17 126/80  01/26/16 (!) 160/84  11/12/14 126/73   Wt Readings from Last 3 Encounters:  03/21/17 153 lb (69.4 kg)  01/26/16 151 lb 6.4 oz (68.7 kg)  11/12/14 149 lb 9.6 oz (67.9 kg)   Immunization History  Administered Date(s) Administered  . Influenza,inj,Quad PF,6+ Mos 03/03/2014, 03/21/2017  . Influenza-Unspecified 04/10/2013  . Pneumococcal Conjugate-13 05/19/2014  . Pneumococcal Polysaccharide-23 01/26/2016  . Tdap 10/15/2012  . Zoster 10/10/2010    Review of Systems  Constitutional: Negative for activity change, appetite change, chills, diaphoresis, fatigue, fever and unexpected weight change.  HENT: Negative for congestion, dental problem, drooling, ear discharge, ear pain, facial swelling, hearing loss, mouth sores, nosebleeds, postnasal drip, rhinorrhea, sinus pressure, sneezing, sore throat, tinnitus, trouble swallowing and voice change.   Eyes: Negative for photophobia, pain, discharge, redness, itching and visual disturbance.  Respiratory: Negative for apnea, cough, choking, chest tightness, shortness of breath, wheezing and stridor.   Cardiovascular: Negative for chest pain, palpitations and leg swelling.  Gastrointestinal: Negative for abdominal distention, abdominal pain, anal bleeding, blood in stool, constipation, diarrhea, nausea, rectal pain and vomiting.  Endocrine: Negative for cold intolerance, heat intolerance, polydipsia, polyphagia and polyuria.  Genitourinary: Negative for decreased urine volume, difficulty urinating, dyspareunia, dysuria, enuresis, flank pain, frequency, genital sores, hematuria, menstrual problem, pelvic pain, urgency, vaginal bleeding, vaginal discharge and vaginal pain.  Musculoskeletal:  Negative for arthralgias, back pain, gait problem, joint swelling, myalgias, neck pain and neck stiffness.  Skin: Negative for color change, pallor, rash and wound.  Allergic/Immunologic: Negative for environmental allergies, food allergies and immunocompromised state.  Neurological: Negative for dizziness, tremors, seizures, syncope, facial asymmetry, speech difficulty, weakness, light-headedness, numbness and headaches.  Hematological: Negative for adenopathy. Does not bruise/bleed easily.  Psychiatric/Behavioral: Negative for agitation, behavioral problems, confusion, decreased concentration, dysphoric mood, hallucinations, self-injury, sleep disturbance and suicidal ideas. The patient is not nervous/anxious and is not hyperactive.     Past Medical History:  Diagnosis Date  . Allergy   . Depression   . Diverticulosis   . Extra mammary Paget disease   . 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 Hx of colonic polyps 09/29/2011  . Hyperlipidemia   . Macular degeneration 10/09/2012   John Matthews/Retinal specialist  . Lebron Quam 09/29/2011  . Osteoporosis    Past Surgical History:  Procedure Laterality Date  . Admission  07/11/2002   GI bleeding due to polyp.    . COLONOSCOPY W/ POLYPECTOMY  11/09/2010   Buccini; every 2-3 years  . extramammary paget's resection perianal region 07/06/2014 [facility] Colorectal surgery/Hopkins  07/06/2014  . EYE SURGERY     Cataract resection  . ROTATOR CUFF REPAIR  01/08/2005   Right   No Known Allergies Current Outpatient Prescriptions  Medication Sig Dispense Refill  . aspirin 81 MG tablet Take 81 mg by mouth daily.    . Cholecalciferol (VITAMIN D3) 3000 units TABS Take by mouth.    . Multiple Vitamins-Minerals (PRESERVISION AREDS 2 PO) Take by mouth.     No current facility-administered medications for this visit.    Social History   Social History  . Marital status: Single     Spouse name: N/A  .  Number of children: 0  . Years of education: N/A   Occupational History  . retired     Teaching laboratory technician; retired in 2004.   Social History Main Topics  . Smoking status: Former Smoker    Years: 30.00    Types: Cigarettes    Quit date: 08/26/2006  . Smokeless tobacco: Never Used  . Alcohol use 10.8 oz/week    18 Cans of beer per week     Comment: drinks 2 beers daily; 4 beers on Friday and Saturdays.  . Drug use: No  . Sexual activity: Not Currently    Partners: Female     Comment: Same sex sexual partners   Other Topics Concern  . Not on file   Social History Narrative   Marital status: single.  Dating x casually/seriously.   Seeing once weekly.      Children: none     Lives: with ex-partner since 2012; moving out in 10/2012.Lives alone in [place] in home.          Employment: retired since 2004; school psychologist x 30 years.        Tobacco: quit smoking 2004.       Alcohol:  beer on weekends.      Drugs: none      Exercise: no  Walking or hiking in 2018. Goal is 10,000 steps per day in 2018.      Seatbelt: 100%; no texting while driving. Flip phone in 2017.      Sunscreen:  Sporadic.      Guns: none.      Sexual activity: same sex partners.      ADLs: independent life; drives.      Advanced Directive:  Yes; Animal nutritionist.   FULL CODE.                       Family History  Problem Relation Age of Onset  . Alzheimer's disease Mother   . Cancer Mother 67       Breast cancer  . Osteoporosis Mother   . Stroke Mother   . Heart disease Father 67       AMI, CHF  . Kidney disease Father 13       ESRD/HD  . Hyperlipidemia Father   . Hypertension Father   . Stroke Brother 73       TIA  . Hyperlipidemia Sister   . Hypertension Sister   . Cancer Sister 46       oral cancer       Objective:    BP 126/80   Pulse 78   Temp 98 F (36.7 C) (Oral)   Resp 16   Ht 5' 2.99" (1.6 m)   Wt 153 lb (69.4 kg)   SpO2 98%   BMI 27.11  kg/m  Physical Exam  Constitutional: She is oriented to person, place, and time. She appears well-developed and well-nourished. No distress.  HENT:  Head: Normocephalic and atraumatic.  Right Ear: External ear normal.  Left Ear: External ear normal.  Nose: Nose normal.  Mouth/Throat: Oropharynx is clear and moist.  Eyes: Pupils are equal, round, and reactive to light. Conjunctivae and EOM are normal.  Neck: Normal range of motion and full passive range of motion without pain. Neck supple. No JVD present. Carotid bruit is not present. No thyromegaly present.  Cardiovascular: Normal rate, regular rhythm and normal heart sounds.  Exam reveals no gallop and no friction rub.   No murmur  heard. Pulmonary/Chest: Effort normal and breath sounds normal. She has no wheezes. She has no rales. Right breast exhibits no inverted nipple, no mass, no nipple discharge, no skin change and no tenderness. Left breast exhibits no inverted nipple, no mass, no nipple discharge, no skin change and no tenderness. Breasts are symmetrical.  Abdominal: Soft. Bowel sounds are normal. She exhibits no distension and no mass. There is no tenderness. There is no rebound and no guarding.  Musculoskeletal:       Right shoulder: Normal.       Left shoulder: Normal.       Cervical back: Normal.  Lymphadenopathy:    She has no cervical adenopathy.  Neurological: She is alert and oriented to person, place, and time. She has normal reflexes. No cranial nerve deficit. She exhibits normal muscle tone. Coordination normal.  Skin: Skin is warm and dry. No rash noted. She is not diaphoretic. No erythema. No pallor.  Psychiatric: She has a normal mood and affect. Her behavior is normal. Judgment and thought content normal.  Nursing note and vitals reviewed.   No results found.        Assessment & Plan:   1. Medicare annual wellness visit, subsequent   2. Routine physical examination   3. Need for prophylactic vaccination  and inoculation against influenza   4. History of smoking   5. Tobacco use   6. Age-related osteoporosis without current pathological fracture   7. Dyslipidemia   8. Hx of colonic polyps     -anticipatory guidance provided --- exercise, weight loss, safe driving practices, aspirin 81mg  daily. -obtain age appropriate screening labs and labs for chronic disease management.   Return in about 1 year (around 03/21/2018) for complete physical examiniation.   Essance Gatti Elayne Guerin, M.D. Primary Care at [facility] previously [facility] [place] [address] [place], [place]  [address] [phone] phone [phone] fax

## 2017-04-18 ENCOUNTER — Encounter: Payer: Medicare Other | Admitting: Family Medicine

## 2017-04-27 ENCOUNTER — Ambulatory Visit
Admission: RE | Admit: 2017-04-27 | Discharge: 2017-04-27 | Disposition: A | Discharge status: Home / Self Care | Payer: Medicare Other | Source: Ambulatory Visit | Attending: Family Medicine | Admitting: Family Medicine

## 2017-04-27 DIAGNOSIS — Z720 Tobacco use: Secondary | ICD-10-CM

## 2017-04-27 DIAGNOSIS — Z87891 Personal history of nicotine dependence: Secondary | ICD-10-CM

## 2017-05-08 ENCOUNTER — Encounter: Payer: Self-pay | Admitting: Family Medicine

## 2017-05-10 ENCOUNTER — Telehealth: Payer: Self-pay | Admitting: Family Medicine

## 2017-05-10 NOTE — Telephone Encounter (Signed)
Dr Tamala Julian patient calling stating that she was charged an OV the day of her physical and want to know what comes with a physical if you can't discuss other issues Eddie Dibbles had spoken with her also and told her the same thing I did that if anything is discussed outside of an physical it'll be a OV charge plus its hung up in the rooms on back of the doors

## 2017-05-29 ENCOUNTER — Encounter: Payer: Medicare Other | Admitting: Family Medicine

## 2017-08-24 ENCOUNTER — Ambulatory Visit: Payer: Self-pay | Admitting: *Deleted

## 2017-08-24 NOTE — Telephone Encounter (Signed)
Pt reports HTN past several weeks; Tuesday 156/87... Max Wednesday 08/23/17 at 169/98.  This am 160/95, hour ago at 1150 152/89.   No other symptoms, is not on any B/P medications. Attempting to secure appt with PCP Dr. Tamala Julian as chronic problem. Unable to secure appt until April. Flow coordinator made aware, awaiting instruction. Will call pt back with appt. confirmation.   Reason for Disposition . [0] Systolic BP  >= 600 OR Diastolic >= 90 AND [4] not taking BP medications  Answer Assessment - Initial Assessment Questions 1. BLOOD PRESSURE: "What is the blood pressure?" "Did you take at least two measurements 5 minutes apart?"     160/95   152/89; Yesterday max at 169/98 2. ONSET: "When did you take your blood pressure?"     Hour ago at 1120 3. HOW: "How did you obtain the blood pressure?" (e.g., visiting nurse, automatic home BP monitor)    Home monitor 4. HISTORY: "Do you have a history of high blood pressure?"    No 5. MEDICATIONS: "Are you taking any medications for blood pressure?" "Have you missed any doses recently?"     No 6. OTHER SYMPTOMS: "Do you have any symptoms?" (e.g., headache, chest pain, blurred vision, difficulty breathing, weakness)     No 7. PREGNANCY: "Is there any chance you are pregnant?" "When was your last menstrual period?"     No  Protocols used: HIGH BLOOD PRESSURE-A-AH

## 2017-08-24 NOTE — Telephone Encounter (Signed)
Appt with Dr. Tamala Julian secured for Wednesday 08/30/17. 'Same Day' appt approved by Nadeen Landau Coordinator.

## 2017-08-30 ENCOUNTER — Other Ambulatory Visit: Payer: Self-pay

## 2017-08-30 ENCOUNTER — Encounter: Payer: Self-pay | Admitting: Family Medicine

## 2017-08-30 ENCOUNTER — Ambulatory Visit: Payer: Medicare Other | Admitting: Family Medicine

## 2017-08-30 VITALS — BP 150/82 | HR 75 | Temp 98.0°F | Resp 18 | Ht 63.19 in | Wt 155.0 lb

## 2017-08-30 DIAGNOSIS — C4499 Other specified malignant neoplasm of skin, unspecified: Secondary | ICD-10-CM

## 2017-08-30 DIAGNOSIS — I10 Essential (primary) hypertension: Secondary | ICD-10-CM | POA: Diagnosis not present

## 2017-08-30 DIAGNOSIS — E785 Hyperlipidemia, unspecified: Secondary | ICD-10-CM | POA: Diagnosis not present

## 2017-08-30 MED ORDER — HYDROCHLOROTHIAZIDE 12.5 MG PO TABS: 12.5000 mg | ORAL_TABLET | Freq: Every day | ORAL | 1 refills | Status: DC

## 2017-08-30 NOTE — Progress Notes (Signed)
Subjective:    Patient ID: Candice Saunders, female    DOB: 1951/03/10, 67 y.o.   MRN: [id number]  08/30/2017  Hypertension (pt states her B/P has been elavated x 2 weeks )    HPI This 67 y.o. female presents for evaluation of elevated blood pressure. Readings elevated at colonoscopy. Increased activity; senior fitness twice per week which is free.  Walking neighbors dog every day. Not drinking.  Weight is stable.   Eating is healthy.  Watching salt.  Sister with hypertension.  Maintained on medications for past ten years; 66 years old.  Father with CHF. New hiker.   Tafeen evaluation recently.  Underwent oncology follow-up in October 2019.  Repeat colonoscopy recommended by oncology.   BP Readings from Last 3 Encounters:  08/30/17 (!) 150/82  03/21/17 126/80  01/26/16 (!) 160/84   Wt Readings from Last 3 Encounters:  08/30/17 155 lb (70.3 kg)  03/21/17 153 lb (69.4 kg)  01/26/16 151 lb 6.4 oz (68.7 kg)   Immunization History  Administered Date(s) Administered  . Influenza,inj,Quad PF,6+ Mos 03/03/2014, 03/21/2017  . Influenza-Unspecified 04/10/2013  . Pneumococcal Conjugate-13 05/19/2014  . Pneumococcal Polysaccharide-23 01/26/2016  . Tdap 10/15/2012  . Zoster 10/10/2010    Review of Systems  Constitutional: Negative for chills, diaphoresis, fatigue and fever.  Eyes: Negative for visual disturbance.  Respiratory: Negative for cough and shortness of breath.   Cardiovascular: Negative for chest pain, palpitations and leg swelling.  Gastrointestinal: Negative for abdominal pain, constipation, diarrhea, nausea and vomiting.  Endocrine: Negative for cold intolerance, heat intolerance, polydipsia, polyphagia and polyuria.  Neurological: Negative for dizziness, tremors, seizures, syncope, facial asymmetry, speech difficulty, weakness, light-headedness, numbness and headaches.    Past Medical History:  Diagnosis Date  . Allergy   . Depression   . Diverticulosis     . Extra mammary Paget disease   . 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 Hx of colonic polyps 09/29/2011  . Hyperlipidemia   . Macular degeneration 10/09/2012   [facility]/Retinal specialist  . Lebron Quam 09/29/2011  . Osteoporosis    Past Surgical History:  Procedure Laterality Date  . Admission  07/11/2002   GI bleeding due to polyp.    . COLONOSCOPY W/ POLYPECTOMY  11/09/2010   Buccini; every 2-3 years  . extramammary paget's resection perianal region 07/06/2014 [facility] Colorectal surgery/[facility]  07/06/2014  . EYE SURGERY     Cataract resection  . ROTATOR CUFF REPAIR  01/08/2005   Right   No Known Allergies Current Outpatient Medications on File Prior to Visit  Medication Sig Dispense Refill  . aspirin 81 MG tablet Take 81 mg by mouth daily.    . Cholecalciferol (VITAMIN D3) 3000 units TABS Take by mouth.    . Multiple Vitamins-Minerals (PRESERVISION AREDS 2 PO) Take by mouth.    . polyethylene glycol-electrolytes (NULYTELY/GOLYTELY) 420 g solution MIX AND DRINK UTD  0   No current facility-administered medications on file prior to visit.    Social History   Socioeconomic History  . Marital status: Single    Spouse name: Not on file  . Number of children: 0  . Years of education: Not on file  . Highest education level: Not on file  Social Needs  . Financial resource strain: Not on file  . Food insecurity - worry: Not on file  . Food insecurity - inability: Not on file  . Transportation needs - medical: Not on file  .  Transportation needs - non-medical: Not on file  Occupational History  . Occupation: retired    Comment: Teaching laboratory technician; retired in 2004.  Tobacco Use  . Smoking status: Former Smoker    Years: 30.00    Types: Cigarettes    Last attempt to quit: 08/26/2006    Years since quitting: 11.0  . Smokeless tobacco: Never Used  Substance and Sexual Activity  . Alcohol use: Yes     Alcohol/week: 10.8 oz    Types: 18 Cans of beer per week    Comment: drinks 2 beers daily; 4 beers on Friday and Saturdays.  . Drug use: No  . Sexual activity: Not Currently    Partners: Female    Comment: Same sex sexual partners  Other Topics Concern  . Not on file  Social History Narrative   Marital status: single.  Dating x casually/seriously.   Seeing once weekly.      Children: none     Lives: with ex-partner since 2012; moving out in 10/2012.Lives alone in [place] in home.          Employment: retired since 2004; school psychologist x 30 years.        Tobacco: quit smoking 2004.       Alcohol:  beer on weekends.      Drugs: none      Exercise: no  Walking or hiking in 2018. Goal is 10,000 steps per day in 2018.      Seatbelt: 100%; no texting while driving. Flip phone in 2017.      Sunscreen:  Sporadic.      Guns: none.      Sexual activity: same sex partners.      ADLs: independent life; drives.      Advanced Directive:  Yes; Animal nutritionist.   FULL CODE.                    Family History  Problem Relation Age of Onset  . Alzheimer's disease Mother   . Cancer Mother 2       Breast cancer  . Osteoporosis Mother   . Stroke Mother   . Heart disease Father 6       AMI, CHF  . Kidney disease Father 34       ESRD/HD  . Hyperlipidemia Father   . Hypertension Father   . Stroke Brother 73       TIA  . Hyperlipidemia Sister   . Hypertension Sister   . Cancer Sister 49       oral cancer       Objective:    BP (!) 150/82   Pulse 75   Temp 98 F (36.7 C) (Oral)   Resp 18   Ht 5' 3.19" (1.605 m)   Wt 155 lb (70.3 kg)   SpO2 97%   BMI 27.29 kg/m  Physical Exam  Constitutional: She is oriented to person, place, and time. She appears well-developed and well-nourished. No distress.  HENT:  Head: Normocephalic and atraumatic.  Right Ear: External ear normal.  Left Ear: External ear normal.  Nose: Nose normal.  Mouth/Throat: Oropharynx is clear and  moist.  Eyes: Conjunctivae and EOM are normal. Pupils are equal, round, and reactive to light.  Neck: Normal range of motion. Neck supple. Carotid bruit is not present. No thyromegaly present.  Cardiovascular: Normal rate, regular rhythm, normal heart sounds and intact distal pulses. Exam reveals no gallop and no friction rub.  No murmur heard.  Pulmonary/Chest: Effort normal and breath sounds normal. She has no wheezes. She has no rales.  Abdominal: Soft. Bowel sounds are normal. She exhibits no distension and no mass. There is no tenderness. There is no rebound and no guarding.  Lymphadenopathy:    She has no cervical adenopathy.  Neurological: She is alert and oriented to person, place, and time. No cranial nerve deficit.  Skin: Skin is warm and dry. No rash noted. She is not diaphoretic. No erythema. No pallor.  Psychiatric: She has a normal mood and affect. Her behavior is normal.   No results found. Depression screen [facility] 2/9 08/30/2017 03/21/2017 01/26/2016 11/12/2014 05/19/2014  Decreased Interest 0 0 0 0 0  Down, Depressed, Hopeless 0 0 0 0 0  PHQ - 2 Score 0 0 0 0 0   Fall Risk  08/30/2017 03/21/2017 01/26/2016 11/12/2014 05/19/2014  Falls in the past year? No No No No No  Number falls in past yr: - - - - -  Injury with Fall? - - - - -        Assessment & Plan:   1. Essential hypertension   2. Extra mammary Paget disease   3. Dyslipidemia     New onset hypertension.  Obtain labs, urinalysis, EKG for initial evaluation.  Initiate hydrochlorothiazide 12.5 mg 1 tablet daily.  Anticipate will need to increase or adjust medication.  Recommend monitoring blood pressure once daily.  Recommend exercise, weight loss, low-sodium diet.  Extramammary Paget's disease: Continues with frequent surveillance with dermatology and gastroenterology and oncology.  Orders Placed This Encounter  Procedures  . CBC with Differential/Platelet  . Comprehensive metabolic panel    Order Specific Question:    Has the patient fasted?    Answer:   No  . Urinalysis, dipstick only  . TSH  . EKG 12-Lead   Meds ordered this encounter  Medications  . hydrochlorothiazide (HYDRODIURIL) 12.5 MG tablet    Sig: Take 1 tablet (12.5 mg total) by mouth daily.    Dispense:  90 tablet    Refill:  1    Return in about 6 weeks (around 10/11/2017) for recheck high blood pressure.   Khristin Keleher Elayne Guerin, M.D. Primary Care at [facility] previously [facility] [place] [address] [place], [place]  [address] [phone] phone [phone] fax

## 2017-08-30 NOTE — Patient Instructions (Addendum)
   IF you received an x-ray today, you will receive an invoice from [facility]. Please contact [facility] at [phone] with questions or concerns regarding your invoice.   IF you received labwork today, you will receive an invoice from [facility]. Please contact [facility] at [phone] with questions or concerns regarding your invoice.   Our billing staff will not be able to assist you with questions regarding bills from these companies.  You will be contacted with the lab results as soon as they are available. The fastest way to get your results is to activate your My Chart account. Instructions are located on the last page of this paperwork. If you have not heard from us regarding the results in 2 weeks, please contact this office.      Managing Your Hypertension Hypertension is commonly called high blood pressure. This is when the force of your blood pressing against the walls of your arteries is too strong. Arteries are blood vessels that carry blood from your heart throughout your body. Hypertension forces the heart to work harder to pump blood, and may cause the arteries to become narrow or stiff. Having untreated or uncontrolled hypertension can cause heart attack, stroke, kidney disease, and other problems. What are blood pressure readings? A blood pressure reading consists of a higher number over a lower number. Ideally, your blood pressure should be below 120/80. The first ("top") number is called the systolic pressure. It is a measure of the pressure in your arteries as your heart beats. The second ("bottom") number is called the diastolic pressure. It is a measure of the pressure in your arteries as the heart relaxes. What does my blood pressure reading mean? Blood pressure is classified into four stages. Based on your blood pressure reading, your health care provider may use the following stages to determine what type of treatment you need, if any. Systolic  pressure and diastolic pressure are measured in a unit called mm Hg. Normal  Systolic pressure: below 120.  Diastolic pressure: below 80. Elevated  Systolic pressure: 120-129.  Diastolic pressure: below 80. Hypertension stage 1  Systolic pressure: 130-139.  Diastolic pressure: 80-89. Hypertension stage 2  Systolic pressure: 140 or above.  Diastolic pressure: 90 or above. What health risks are associated with hypertension? Managing your hypertension is an important responsibility. Uncontrolled hypertension can lead to:  A heart attack.  A stroke.  A weakened blood vessel (aneurysm).  Heart failure.  Kidney damage.  Eye damage.  Metabolic syndrome.  Memory and concentration problems.  What changes can I make to manage my hypertension? Hypertension can be managed by making lifestyle changes and possibly by taking medicines. Your health care provider will help you make a plan to bring your blood pressure within a normal range. Eating and drinking  Eat a diet that is high in fiber and potassium, and low in salt (sodium), added sugar, and fat. An example eating plan is called the DASH (Dietary Approaches to Stop Hypertension) diet. To eat this way: ? Eat plenty of fresh fruits and vegetables. Try to fill half of your plate at each meal with fruits and vegetables. ? Eat whole grains, such as whole wheat pasta, brown rice, or whole grain bread. Fill about one quarter of your plate with whole grains. ? Eat low-fat diary products. ? Avoid fatty cuts of meat, processed or cured meats, and poultry with skin. Fill about one quarter of your plate with lean proteins such as fish, chicken without skin, beans, eggs,   and tofu. ? Avoid premade and processed foods. These tend to be higher in sodium, added sugar, and fat.  Reduce your daily sodium intake. Most people with hypertension should eat less than 1,500 mg of sodium a day.  Limit alcohol intake to no more than 1 drink a day  for nonpregnant women and 2 drinks a day for men. One drink equals 12 oz of beer, 5 oz of wine, or 1 oz of hard liquor. Lifestyle  Work with your health care provider to maintain a healthy body weight, or to lose weight. Ask what an ideal weight is for you.  Get at least 30 minutes of exercise that causes your heart to beat faster (aerobic exercise) most days of the week. Activities may include walking, swimming, or biking.  Include exercise to strengthen your muscles (resistance exercise), such as weight lifting, as part of your weekly exercise routine. Try to do these types of exercises for 30 minutes at least 3 days a week.  Do not use any products that contain nicotine or tobacco, such as cigarettes and e-cigarettes. If you need help quitting, ask your health care provider.  Control any long-term (chronic) conditions you have, such as high cholesterol or diabetes. Monitoring  Monitor your blood pressure at home as told by your health care provider. Your personal target blood pressure may vary depending on your medical conditions, your age, and other factors.  Have your blood pressure checked regularly, as often as told by your health care provider. Working with your health care provider  Review all the medicines you take with your health care provider because there may be side effects or interactions.  Talk with your health care provider about your diet, exercise habits, and other lifestyle factors that may be contributing to hypertension.  Visit your health care provider regularly. Your health care provider can help you create and adjust your plan for managing hypertension. Will I need medicine to control my blood pressure? Your health care provider may prescribe medicine if lifestyle changes are not enough to get your blood pressure under control, and if:  Your systolic blood pressure is 130 or higher.  Your diastolic blood pressure is 80 or higher.  Take medicines only as told  by your health care provider. Follow the directions carefully. Blood pressure medicines must be taken as prescribed. The medicine does not work as well when you skip doses. Skipping doses also puts you at risk for problems. Contact a health care provider if:  You think you are having a reaction to medicines you have taken.  You have repeated (recurrent) headaches.  You feel dizzy.  You have swelling in your ankles.  You have trouble with your vision. Get help right away if:  You develop a severe headache or confusion.  You have unusual weakness or numbness, or you feel faint.  You have severe pain in your chest or abdomen.  You vomit repeatedly.  You have trouble breathing. Summary  Hypertension is when the force of blood pumping through your arteries is too strong. If this condition is not controlled, it may put you at risk for serious complications.  Your personal target blood pressure may vary depending on your medical conditions, your age, and other factors. For most people, a normal blood pressure is less than 120/80.  Hypertension is managed by lifestyle changes, medicines, or both. Lifestyle changes include weight loss, eating a healthy, low-sodium diet, exercising more, and limiting alcohol. This information is not intended to replace advice   given to you by your health care provider. Make sure you discuss any questions you have with your health care provider. Document Released: 03/21/2012 Document Revised: 05/25/2016 Document Reviewed: 05/25/2016 Elsevier Interactive Patient Education  2018 [employer]  

## 2017-08-31 LAB — CBC WITH DIFFERENTIAL/PLATELET
BASOS ABS: 0 10*3/uL (ref 0.0–0.2)
Basos: 1 %
EOS (ABSOLUTE): 0.1 10*3/uL (ref 0.0–0.4)
Eos: 2 %
HEMATOCRIT: 39 % (ref 34.0–46.6)
Hemoglobin: 13.9 g/dL (ref 11.1–15.9)
Immature Grans (Abs): 0 10*3/uL (ref 0.0–0.1)
Immature Granulocytes: 0 %
LYMPHS ABS: 2.4 10*3/uL (ref 0.7–3.1)
Lymphs: 40 %
MCH: 31.5 pg (ref 26.6–33.0)
MCHC: 35.6 g/dL (ref 31.5–35.7)
MCV: 88 fL (ref 79–97)
MONOS ABS: 0.5 10*3/uL (ref 0.1–0.9)
Monocytes: 8 %
NEUTROS PCT: 49 %
Neutrophils Absolute: 2.9 10*3/uL (ref 1.4–7.0)
PLATELETS: 337 10*3/uL (ref 150–379)
RBC: 4.41 x10E6/uL (ref 3.77–5.28)
RDW: 13.4 % (ref 12.3–15.4)
WBC: 5.9 10*3/uL (ref 3.4–10.8)

## 2017-08-31 LAB — URINALYSIS, DIPSTICK ONLY
BILIRUBIN UA: NEGATIVE
GLUCOSE, UA: NEGATIVE
Ketones, UA: NEGATIVE
Nitrite, UA: NEGATIVE
PH UA: 5.5 (ref 5.0–7.5)
PROTEIN UA: NEGATIVE
RBC, UA: NEGATIVE
SPEC GRAV UA: 1.019 (ref 1.005–1.030)
Urobilinogen, Ur: 0.2 mg/dL (ref 0.2–1.0)

## 2017-08-31 LAB — COMPREHENSIVE METABOLIC PANEL
ALK PHOS: 87 IU/L (ref 39–117)
ALT: 17 IU/L (ref 0–32)
AST: 16 IU/L (ref 0–40)
Albumin/Globulin Ratio: 1.6 (ref 1.2–2.2)
Albumin: 4.3 g/dL (ref 3.6–4.8)
BUN/Creatinine Ratio: 19 (ref 12–28)
BUN: 15 mg/dL (ref 8–27)
Bilirubin Total: <0.2 mg/dL (ref 0.0–1.2)
CO2: 24 mmol/L (ref 20–29)
CREATININE: 0.8 mg/dL (ref 0.57–1.00)
Calcium: 9.2 mg/dL (ref 8.7–10.3)
Chloride: 98 mmol/L (ref 96–106)
GFR calc Af Amer: 88 mL/min/{1.73_m2} (ref >59)
GFR calc non Af Amer: 76 mL/min/{1.73_m2} (ref >59)
GLOBULIN, TOTAL: 2.7 g/dL (ref 1.5–4.5)
GLUCOSE: 90 mg/dL (ref 65–99)
Potassium: 4.2 mmol/L (ref 3.5–5.2)
SODIUM: 136 mmol/L (ref 134–144)
Total Protein: 7 g/dL (ref 6.0–8.5)

## 2017-08-31 LAB — TSH: TSH: 1.58 u[IU]/mL (ref 0.450–4.500)

## 2017-10-10 NOTE — Progress Notes (Signed)
Subjective:    Patient ID: Candice [facility], female    DOB: [birth year], 67 y.o.   MRN: [id number]  10/11/2017  Hypertension (follow up appt for blood pressure management)    HPI This 67 y.o. female presents for six week follow-up of new onset hypertension.  Management changes made at last visit includes: -New onset hypertension.  Obtain labs, urinalysis, EKG for initial evaluation.  Initiate hydrochlorothiazide 12.5 mg 1 tablet daily.  Anticipate will need to increase or adjust medication.  Recommend monitoring blood pressure once daily.  Recommend exercise, weight loss, low-sodium diet. 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  UPDATE: 135/88, 134/86, 158/93, 128/85, trend is down.   No side effects to medication/HCTZ. No increase in urination.  Increased exercise; senior fitness class at [name] which is walking distance from house.  Walks with friend; last week went to a different [name].  Class twice weekly; walking four times per week.  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    BP Readings from Last 3 Encounters:  10/11/17 118/68  08/30/17 (!) 150/82  03/21/17 126/80   Wt Readings from Last 3 Encounters:  10/11/17 156 lb (70.8 kg)  08/30/17 155 lb (70.3 kg)  03/21/17 153 lb (69.4 kg)   Immunization History  Administered Date(s) Administered  . Influenza,inj,Quad PF,6+ Mos 03/03/2014, 03/21/2017  . Influenza-Unspecified 04/10/2013  . Pneumococcal Conjugate-13 05/19/2014  . Pneumococcal Polysaccharide-23 01/26/2016  . Tdap 10/15/2012  . Zoster 10/10/2010    Review of Systems  Constitutional: Negative for chills, diaphoresis, fatigue and fever.  Eyes: Negative for visual disturbance.  Respiratory: Negative for cough and shortness of breath.   Cardiovascular: Negative for chest pain, palpitations and leg swelling.  Gastrointestinal: Negative for abdominal pain, constipation, diarrhea, nausea and  vomiting.  Endocrine: Negative for cold intolerance, heat intolerance, polydipsia, polyphagia and polyuria.  Neurological: Negative for dizziness, tremors, seizures, syncope, facial asymmetry, speech difficulty, weakness, light-headedness, numbness and headaches.    Past Medical History:  Diagnosis Date  . Allergy   . Depression   . Diverticulosis   . Extra mammary Paget disease   . 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 Hx of colonic polyps 09/29/2011  . Hyperlipidemia   . Macular degeneration 10/09/2012   Candice [facility]/Retinal specialist  . Lebron Quam 09/29/2011  . Osteoporosis    Past Surgical History:  Procedure Laterality Date  . Admission  07/11/2002   GI bleeding due to polyp.    . COLONOSCOPY W/ POLYPECTOMY  11/09/2010   Buccini; every 2-3 years  . extramammary paget's resection perianal region 07/06/2014 [facility]  07/06/2014  . EYE SURGERY     Cataract resection  . ROTATOR CUFF REPAIR  01/08/2005   Right   No Known Allergies Current Outpatient Medications on File Prior to Visit  Medication Sig Dispense Refill  . aspirin 81 MG tablet Take 81 mg by mouth daily.    . Cholecalciferol (VITAMIN D3) 3000 units TABS Take by mouth.    . hydrochlorothiazide (HYDRODIURIL) 12.5 MG tablet Take 1 tablet (12.5 mg total) by mouth daily. 90 tablet 1  . Multiple Vitamins-Minerals (PRESERVISION AREDS 2 PO) Take by mouth.    . polyethylene glycol-electrolytes (NULYTELY/GOLYTELY) 420 g solution MIX AND DRINK UTD  0   No current facility-administered medications on file prior to visit.    Social History   Socioeconomic History  . Marital status: Single  Spouse name: Not on file  . Number of children: 0  . Years of education: Not on file  . Highest education level: Not on file  Occupational History  . Occupation: retired    Comment: Teaching laboratory technician; retired in 2004.  Social Needs  .  Financial resource strain: Not on file  . Food insecurity:    Worry: Not on file    Inability: Not on file  . Transportation needs:    Medical: Not on file    Non-medical: Not on file  Tobacco Use  . Smoking status: Former Smoker    Years: 30.00    Types: Cigarettes    Last attempt to quit: 08/26/2006    Years since quitting: 11.2  . Smokeless tobacco: Never Used  Substance and Sexual Activity  . Alcohol use: Yes    Alcohol/week: 10.8 oz    Types: 18 Cans of beer per week    Comment: drinks 2 beers daily; 4 beers on Friday and Saturdays.  . Drug use: No  . Sexual activity: Not Currently    Partners: Female    Comment: Same sex sexual partners  Lifestyle  . Physical activity:    Days per week: Not on file    Minutes per session: Not on file  . Stress: Not on file  Relationships  . Social connections:    Talks on phone: Not on file    Gets together: Not on file    Attends religious service: Not on file    Active member of club or organization: Not on file    Attends meetings of clubs or organizations: Not on file    Relationship status: Not on file  . Intimate partner violence:    Fear of current or ex partner: Not on file    Emotionally abused: Not on file    Physically abused: Not on file    Forced sexual activity: Not on file  Other Topics Concern  . Not on file  Social History Narrative   Marital status: single.  Dating x casually/seriously.   Seeing once weekly.      Children: none     Lives: with ex-partner since 2012; moving out in 10/2012.Lives alone in [place] in home.          Employment: retired since 2004; school psychologist x 30 years.        Tobacco: quit smoking 2004.       Alcohol:  beer on weekends.      Drugs: none      Exercise: no  Walking or hiking in 2018. Goal is 10,000 steps per day in 2018.      Seatbelt: 100%; no texting while driving. Flip phone in 2017.      Sunscreen:  Sporadic.      Guns: none.      Sexual activity: same sex partners.       ADLs: independent life; drives.      Advanced Directive:  Yes; Animal nutritionist.   FULL CODE.                    Family History  Problem Relation Age of Onset  . Alzheimer's disease Mother   . Cancer Mother 65       Breast cancer  . Osteoporosis Mother   . Stroke Mother   . Heart disease Father 7       AMI, CHF  . Kidney disease Father 64       ESRD/HD  .  Hyperlipidemia Father   . Hypertension Father   . Stroke Brother 73       TIA  . Hyperlipidemia Sister   . Hypertension Sister   . Cancer Sister 65       oral cancer       Objective:    BP 118/68 (BP Location: Left Arm, Patient Position: Sitting, Cuff Size: Normal)   Pulse 90   Temp 98.6 F (37 C) (Oral)   Ht 5' 3.5" (1.613 m)   Wt 156 lb (70.8 kg)   SpO2 98%   BMI 27.20 kg/m  Physical Exam  Constitutional: She is oriented to person, place, and time. She appears well-developed and well-nourished. No distress.  HENT:  Head: Normocephalic and atraumatic.  Right Ear: External ear normal.  Left Ear: External ear normal.  Nose: Nose normal.  Mouth/Throat: Oropharynx is clear and moist.  Eyes: Pupils are equal, round, and reactive to light. Conjunctivae and EOM are normal.  Neck: Normal range of motion. Neck supple. Carotid bruit is not present. No thyromegaly present.  Cardiovascular: Normal rate, regular rhythm, normal heart sounds and intact distal pulses. Exam reveals no gallop and no friction rub.  No murmur heard. Pulmonary/Chest: Effort normal and breath sounds normal. She has no wheezes. She has no rales.  Abdominal: Soft. Bowel sounds are normal. She exhibits no distension and no mass. There is no tenderness. There is no rebound and no guarding.  Lymphadenopathy:    She has no cervical adenopathy.  Neurological: She is alert and oriented to person, place, and time. No cranial nerve deficit.  Skin: Skin is warm and dry. No rash noted. She is not diaphoretic. No erythema. No pallor.    Psychiatric: She has a normal mood and affect. Her behavior is normal.   No results found. Depression screen [facility] 2/9 10/11/2017 08/30/2017 03/21/2017 01/26/2016 11/12/2014  Decreased Interest 0 0 0 0 0  Down, Depressed, Hopeless 0 0 0 0 0  PHQ - 2 Score 0 0 0 0 0   Fall Risk  10/11/2017 08/30/2017 03/21/2017 01/26/2016 11/12/2014  Falls in the past year? No No No No No  Number falls in past yr: - - - - -  Injury with Fall? - - - - -        Assessment & Plan:   1. Essential hypertension, benign   2. Dyslipidemia     Hypertension: New onset.  Improved with hydrochlorthiazide therapy.  Obtain metabolic panel.  Continue current medication at this time.  Dyslipidemia: Stable.  Repeat lipid panel today.   I recommend weight loss, exercise, and low-cholesterol low-fat food choices.  I recommend limiting red meat to once per week; I recommend limiting fried foods to once per month.   Orders Placed This Encounter  Procedures  . Comprehensive metabolic panel    Order Specific Question:   Has the patient fasted?    Answer:   No  . Lipid panel    Order Specific Question:   Has the patient fasted?    Answer:   No   No orders of the defined types were placed in this encounter.   Return in about 3 months (around 01/10/2018) for recheck blood pressure.   Shariff Lasky Elayne Guerin, M.D. Primary Care at [facility] [address] [place], [place]  [address] [phone] phone [phone] fax

## 2017-10-11 ENCOUNTER — Ambulatory Visit: Payer: Medicare Other | Admitting: Family Medicine

## 2017-10-11 ENCOUNTER — Encounter: Payer: Self-pay | Admitting: Family Medicine

## 2017-10-11 ENCOUNTER — Other Ambulatory Visit: Payer: Self-pay

## 2017-10-11 VITALS — BP 118/68 | HR 90 | Temp 98.6°F | Ht 63.5 in | Wt 156.0 lb

## 2017-10-11 DIAGNOSIS — E785 Hyperlipidemia, unspecified: Secondary | ICD-10-CM

## 2017-10-11 DIAGNOSIS — I10 Essential (primary) hypertension: Secondary | ICD-10-CM

## 2017-10-11 NOTE — Patient Instructions (Addendum)
   IF you received an x-ray today, you will receive an invoice from [facility]. Please contact [facility] at [phone] with questions or concerns regarding your invoice.   IF you received labwork today, you will receive an invoice from [facility]. Please contact [facility] at [phone] with questions or concerns regarding your invoice.   Our billing staff will not be able to assist you with questions regarding bills from these companies.  You will be contacted with the lab results as soon as they are available. The fastest way to get your results is to activate your My Chart account. Instructions are located on the last page of this paperwork. If you have not heard from us regarding the results in 2 weeks, please contact this office.      Managing Your Hypertension Hypertension is commonly called high blood pressure. This is when the force of your blood pressing against the walls of your arteries is too strong. Arteries are blood vessels that carry blood from your heart throughout your body. Hypertension forces the heart to work harder to pump blood, and may cause the arteries to become narrow or stiff. Having untreated or uncontrolled hypertension can cause heart attack, stroke, kidney disease, and other problems. What are blood pressure readings? A blood pressure reading consists of a higher number over a lower number. Ideally, your blood pressure should be below 120/80. The first ("top") number is called the systolic pressure. It is a measure of the pressure in your arteries as your heart beats. The second ("bottom") number is called the diastolic pressure. It is a measure of the pressure in your arteries as the heart relaxes. What does my blood pressure reading mean? Blood pressure is classified into four stages. Based on your blood pressure reading, your health care provider may use the following stages to determine what type of treatment you need, if any. Systolic  pressure and diastolic pressure are measured in a unit called mm Hg. Normal  Systolic pressure: below 120.  Diastolic pressure: below 80. Elevated  Systolic pressure: 120-129.  Diastolic pressure: below 80. Hypertension stage 1  Systolic pressure: 130-139.  Diastolic pressure: 80-89. Hypertension stage 2  Systolic pressure: 140 or above.  Diastolic pressure: 90 or above. What health risks are associated with hypertension? Managing your hypertension is an important responsibility. Uncontrolled hypertension can lead to:  A heart attack.  A stroke.  A weakened blood vessel (aneurysm).  Heart failure.  Kidney damage.  Eye damage.  Metabolic syndrome.  Memory and concentration problems.  What changes can I make to manage my hypertension? Hypertension can be managed by making lifestyle changes and possibly by taking medicines. Your health care provider will help you make a plan to bring your blood pressure within a normal range. Eating and drinking  Eat a diet that is high in fiber and potassium, and low in salt (sodium), added sugar, and fat. An example eating plan is called the DASH (Dietary Approaches to Stop Hypertension) diet. To eat this way: ? Eat plenty of fresh fruits and vegetables. Try to fill half of your plate at each meal with fruits and vegetables. ? Eat whole grains, such as whole wheat pasta, brown rice, or whole grain bread. Fill about one quarter of your plate with whole grains. ? Eat low-fat diary products. ? Avoid fatty cuts of meat, processed or cured meats, and poultry with skin. Fill about one quarter of your plate with lean proteins such as fish, chicken without skin, beans, eggs,   and tofu. ? Avoid premade and processed foods. These tend to be higher in sodium, added sugar, and fat.  Reduce your daily sodium intake. Most people with hypertension should eat less than 1,500 mg of sodium a day.  Limit alcohol intake to no more than 1 drink a day  for nonpregnant women and 2 drinks a day for men. One drink equals 12 oz of beer, 5 oz of wine, or 1 oz of hard liquor. Lifestyle  Work with your health care provider to maintain a healthy body weight, or to lose weight. Ask what an ideal weight is for you.  Get at least 30 minutes of exercise that causes your heart to beat faster (aerobic exercise) most days of the week. Activities may include walking, swimming, or biking.  Include exercise to strengthen your muscles (resistance exercise), such as weight lifting, as part of your weekly exercise routine. Try to do these types of exercises for 30 minutes at least 3 days a week.  Do not use any products that contain nicotine or tobacco, such as cigarettes and e-cigarettes. If you need help quitting, ask your health care provider.  Control any long-term (chronic) conditions you have, such as high cholesterol or diabetes. Monitoring  Monitor your blood pressure at home as told by your health care provider. Your personal target blood pressure may vary depending on your medical conditions, your age, and other factors.  Have your blood pressure checked regularly, as often as told by your health care provider. Working with your health care provider  Review all the medicines you take with your health care provider because there may be side effects or interactions.  Talk with your health care provider about your diet, exercise habits, and other lifestyle factors that may be contributing to hypertension.  Visit your health care provider regularly. Your health care provider can help you create and adjust your plan for managing hypertension. Will I need medicine to control my blood pressure? Your health care provider may prescribe medicine if lifestyle changes are not enough to get your blood pressure under control, and if:  Your systolic blood pressure is 130 or higher.  Your diastolic blood pressure is 80 or higher.  Take medicines only as told  by your health care provider. Follow the directions carefully. Blood pressure medicines must be taken as prescribed. The medicine does not work as well when you skip doses. Skipping doses also puts you at risk for problems. Contact a health care provider if:  You think you are having a reaction to medicines you have taken.  You have repeated (recurrent) headaches.  You feel dizzy.  You have swelling in your ankles.  You have trouble with your vision. Get help right away if:  You develop a severe headache or confusion.  You have unusual weakness or numbness, or you feel faint.  You have severe pain in your chest or abdomen.  You vomit repeatedly.  You have trouble breathing. Summary  Hypertension is when the force of blood pumping through your arteries is too strong. If this condition is not controlled, it may put you at risk for serious complications.  Your personal target blood pressure may vary depending on your medical conditions, your age, and other factors. For most people, a normal blood pressure is less than 120/80.  Hypertension is managed by lifestyle changes, medicines, or both. Lifestyle changes include weight loss, eating a healthy, low-sodium diet, exercising more, and limiting alcohol. This information is not intended to replace advice   given to you by your health care provider. Make sure you discuss any questions you have with your health care provider. Document Released: 03/21/2012 Document Revised: 05/25/2016 Document Reviewed: 05/25/2016 Elsevier Interactive Patient Education  2018 [employer]  

## 2017-10-12 LAB — COMPREHENSIVE METABOLIC PANEL
ALBUMIN: 4.3 g/dL (ref 3.6–4.8)
ALT: 18 IU/L (ref 0–32)
AST: 18 IU/L (ref 0–40)
Albumin/Globulin Ratio: 1.6 (ref 1.2–2.2)
Alkaline Phosphatase: 91 IU/L (ref 39–117)
BUN/Creatinine Ratio: 14 (ref 12–28)
BUN: 12 mg/dL (ref 8–27)
Bilirubin Total: 0.4 mg/dL (ref 0.0–1.2)
CALCIUM: 9.7 mg/dL (ref 8.7–10.3)
CO2: 25 mmol/L (ref 20–29)
CREATININE: 0.84 mg/dL (ref 0.57–1.00)
Chloride: 89 mmol/L — ABNORMAL LOW (ref 96–106)
GFR calc Af Amer: 83 mL/min/{1.73_m2} (ref >59)
GFR calc non Af Amer: 72 mL/min/{1.73_m2} (ref >59)
Globulin, Total: 2.7 g/dL (ref 1.5–4.5)
Glucose: 52 mg/dL — ABNORMAL LOW (ref 65–99)
Potassium: 4.4 mmol/L (ref 3.5–5.2)
SODIUM: 131 mmol/L — AB (ref 134–144)
TOTAL PROTEIN: 7 g/dL (ref 6.0–8.5)

## 2017-10-12 LAB — LIPID PANEL
Chol/HDL Ratio: 2.6 ratio (ref 0.0–4.4)
Cholesterol, Total: 256 mg/dL — ABNORMAL HIGH (ref 100–199)
HDL: 100 mg/dL (ref >39)
LDL CALC: 142 mg/dL — AB (ref 0–99)
TRIGLYCERIDES: 70 mg/dL (ref 0–149)
VLDL CHOLESTEROL CAL: 14 mg/dL (ref 5–40)

## 2017-10-20 ENCOUNTER — Ambulatory Visit (INDEPENDENT_AMBULATORY_CARE_PROVIDER_SITE_OTHER): Payer: Medicare Other | Admitting: Ophthalmology

## 2017-10-27 ENCOUNTER — Ambulatory Visit (INDEPENDENT_AMBULATORY_CARE_PROVIDER_SITE_OTHER): Payer: Medicare Other | Admitting: Ophthalmology

## 2017-11-08 ENCOUNTER — Encounter (INDEPENDENT_AMBULATORY_CARE_PROVIDER_SITE_OTHER): Payer: Medicare Other | Admitting: Ophthalmology

## 2017-11-08 DIAGNOSIS — H43813 Vitreous degeneration, bilateral: Secondary | ICD-10-CM | POA: Diagnosis not present

## 2017-11-08 DIAGNOSIS — H353122 Nonexudative age-related macular degeneration, left eye, intermediate dry stage: Secondary | ICD-10-CM

## 2017-11-08 DIAGNOSIS — H35033 Hypertensive retinopathy, bilateral: Secondary | ICD-10-CM | POA: Diagnosis not present

## 2017-11-08 DIAGNOSIS — I10 Essential (primary) hypertension: Secondary | ICD-10-CM

## 2017-11-08 DIAGNOSIS — H353111 Nonexudative age-related macular degeneration, right eye, early dry stage: Secondary | ICD-10-CM | POA: Diagnosis not present

## 2017-12-04 ENCOUNTER — Encounter: Payer: Self-pay | Admitting: Family Medicine

## 2017-12-05 ENCOUNTER — Encounter: Payer: Self-pay | Admitting: Family Medicine

## 2018-01-03 ENCOUNTER — Encounter: Payer: Self-pay | Admitting: Family Medicine

## 2018-01-05 NOTE — Progress Notes (Signed)
Subjective:    Patient ID: Candice Saunders, female    DOB: December 10, 1950, 67 y.o.   MRN: [id number]  01/10/2018  Hypertension (follow up )    HPI This 67 y.o. female presents for evaluation of hypertension and hyperlipidemia.  No changes to management made at last visit.  Weight down four pounds.  LDL 142 at last visit; sodium 131 and chloride low.  Blood pressure ranges 120-141/70-80s.   Walking one mile daily; two days per week, walking four miles.  Trying to get in a movement fitness session also; has found a couple on YouTube.  Overweight: goal weight of 141 with [employer].  Needs note from doctor.  Made an appointment with Horald Pollen, MD.    Paget's disease extramammary:  S/p follow-up with oncology at [facility] in October 2018 and March 2018.    1.43mm lung nodule Left upper lobe: detected on CT chest lung cancer screening; repeat CT scan in one year recommended.  BP Readings from Last 3 Encounters:  01/10/18 140/82  10/11/17 118/68  08/30/17 (!) 150/82   Wt Readings from Last 3 Encounters:  01/10/18 152 lb (68.9 kg)  10/11/17 156 lb (70.8 kg)  08/30/17 155 lb (70.3 kg)   Immunization History  Administered Date(s) Administered  . Influenza,inj,Quad PF,6+ Mos 03/03/2014, 03/21/2017  . Influenza-Unspecified 04/10/2013  . Pneumococcal Conjugate-13 05/19/2014  . Pneumococcal Polysaccharide-23 01/26/2016  . Tdap 10/15/2012  . Zoster 10/10/2010    Review of Systems  Constitutional: Negative for chills, diaphoresis, fatigue and fever.  Eyes: Negative for visual disturbance.  Respiratory: Negative for cough and shortness of breath.   Cardiovascular: Negative for chest pain, palpitations and leg swelling.  Gastrointestinal: Negative for abdominal pain, constipation, diarrhea, nausea and vomiting.  Endocrine: Negative for cold intolerance, heat intolerance, polydipsia, polyphagia and polyuria.  Neurological: Negative for dizziness, tremors, seizures, syncope, facial asymmetry,  speech difficulty, weakness, light-headedness, numbness and headaches.    Past Medical History:  Diagnosis Date  . Allergy   . Depression   . Diverticulosis   . Extra mammary Paget disease   . 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 Hx of colonic polyps 09/29/2011  . Hyperlipidemia   . Macular degeneration 10/09/2012   John Matthews/Retinal specialist  . Lebron Quam 09/29/2011  . Osteoporosis    Past Surgical History:  Procedure Laterality Date  . Admission  07/11/2002   GI bleeding due to polyp.    . COLONOSCOPY W/ POLYPECTOMY  11/09/2010   Buccini; every 2-3 years  . extramammary paget's resection perianal region 07/06/2014 [facility] Colorectal surgery/[facility]  07/06/2014  . EYE SURGERY     Cataract resection  . ROTATOR CUFF REPAIR  01/08/2005   Right   No Known Allergies Current Outpatient Medications on File Prior to Visit  Medication Sig Dispense Refill  . aspirin 81 MG tablet Take 81 mg by mouth daily.    . Cholecalciferol (VITAMIN D3) 3000 units TABS Take by mouth.    . Multiple Vitamins-Minerals (PRESERVISION AREDS 2 PO) Take by mouth.    . polyethylene glycol-electrolytes (NULYTELY/GOLYTELY) 420 g solution MIX AND DRINK UTD  0   No current facility-administered medications on file prior to visit.    Social History   Socioeconomic History  . Marital status: Single    Spouse name: Not on file  . Number of children: 0  . Years of education: Not on file  . Highest education level: Not on file  Occupational  History  . Occupation: retired    Comment: Teaching laboratory technician; retired in 2004.  Social Needs  . Financial resource strain: Not on file  . Food insecurity:    Worry: Not on file    Inability: Not on file  . Transportation needs:    Medical: Not on file    Non-medical: Not on file  Tobacco Use  . Smoking status: Former Smoker    Years: 30.00    Types: Cigarettes    Last attempt to quit:  08/26/2006    Years since quitting: 11.3  . Smokeless tobacco: Never Used  Substance and Sexual Activity  . Alcohol use: Yes    Alcohol/week: 10.8 oz    Types: 18 Cans of beer per week    Comment: drinks 2 beers daily; 4 beers on Friday and Saturdays.  . Drug use: No  . Sexual activity: Not Currently    Partners: Female    Comment: Same sex sexual partners  Lifestyle  . Physical activity:    Days per week: Not on file    Minutes per session: Not on file  . Stress: Not on file  Relationships  . Social connections:    Talks on phone: Not on file    Gets together: Not on file    Attends religious service: Not on file    Active member of club or organization: Not on file    Attends meetings of clubs or organizations: Not on file    Relationship status: Not on file  . Intimate partner violence:    Fear of current or ex partner: Not on file    Emotionally abused: Not on file    Physically abused: Not on file    Forced sexual activity: Not on file  Other Topics Concern  . Not on file  Social History Narrative   Marital status: single.  Dating x casually/seriously.   Seeing once weekly.      Children: none     Lives: with ex-partner since 2012; moving out in 10/2012.Lives alone in [place] in home.          Employment: retired since 2004; school psychologist x 30 years.        Tobacco: quit smoking 2004.       Alcohol:  beer on weekends.      Drugs: none      Exercise: no  Walking or hiking in 2018. Goal is 10,000 steps per day in 2018.      Seatbelt: 100%; no texting while driving. Flip phone in 2017.      Sunscreen:  Sporadic.      Guns: none.      Sexual activity: same sex partners.      ADLs: independent life; drives.      Advanced Directive:  Yes; Animal nutritionist.   FULL CODE.                    Family History  Problem Relation Age of Onset  . Alzheimer's disease Mother   . Cancer Mother 61       Breast cancer  . Osteoporosis Mother   . Stroke Mother   .  Heart disease Father 37       AMI, CHF  . Kidney disease Father 63       ESRD/HD  . Hyperlipidemia Father   . Hypertension Father   . Stroke Brother 73       TIA  . Hyperlipidemia Sister   .  Hypertension Sister   . Cancer Sister 6       oral cancer       Objective:    BP 140/82   Pulse 78   Temp 98 F (36.7 C) (Oral)   Resp 18   Ht 5' 3.5" (1.613 m)   Wt 152 lb (68.9 kg)   SpO2 99%   BMI 26.50 kg/m  Physical Exam  Constitutional: She is oriented to person, place, and time. She appears well-developed and well-nourished. No distress.  HENT:  Head: Normocephalic and atraumatic.  Right Ear: External ear normal.  Left Ear: External ear normal.  Nose: Nose normal.  Mouth/Throat: Oropharynx is clear and moist.  Eyes: Pupils are equal, round, and reactive to light. Conjunctivae and EOM are normal.  Neck: Normal range of motion. Neck supple. Carotid bruit is not present. No thyromegaly present.  Cardiovascular: Normal rate, regular rhythm, normal heart sounds and intact distal pulses. Exam reveals no gallop and no friction rub.  No murmur heard. Pulmonary/Chest: Effort normal and breath sounds normal. She has no wheezes. She has no rales.  Abdominal: Soft. Bowel sounds are normal. She exhibits no distension and no mass. There is no tenderness. There is no rebound and no guarding.  Lymphadenopathy:    She has no cervical adenopathy.  Neurological: She is alert and oriented to person, place, and time. No cranial nerve deficit.  Skin: Skin is warm and dry. No rash noted. She is not diaphoretic. No erythema. No pallor.  Psychiatric: She has a normal mood and affect. Her behavior is normal.   No results found. Depression screen [facility] 2/9 01/10/2018 10/11/2017 08/30/2017 03/21/2017 01/26/2016  Decreased Interest 0 0 0 0 0  Down, Depressed, Hopeless 0 0 0 0 0  PHQ - 2 Score 0 0 0 0 0   Fall Risk  01/10/2018 10/11/2017 08/30/2017 03/21/2017 01/26/2016  Falls in the past year? No No No No No    Number falls in past yr: - - - - -  Injury with Fall? - - - - -        Assessment & Plan:   1. Essential hypertension, benign   2. Dyslipidemia   3. Extra mammary Paget disease   4. Nodule of upper lobe of left lung     HTN: well controlled; obtain labs due to recent hyponatremia; refill of HCTZ provided.  Dyslipidemia: persistent;  I recommend weight loss, exercise, and low-cholesterol low-fat food choices.  I recommend limiting red meat to once per week; I recommend limiting fried foods to once per month.  Extra mammary Paget Disease: followed by [facility] every 6-12 months.   LUL nodule: New; detected on CT lung cancer screening; 1.67mm; recommends repeat CT chest in October 2018.  Overweight BMI 26: ideal weight of 145 pounds.  Note provided for [employer].  Orders Placed This Encounter  Procedures  . Comprehensive metabolic panel   Meds ordered this encounter  Medications  . hydrochlorothiazide (HYDRODIURIL) 12.5 MG tablet    Sig: Take 1 tablet (12.5 mg total) by mouth daily.    Dispense:  90 tablet    Refill:  1    Return for follow-up chronic medical conditions RICHTER.Norwood Levo, M.D. Primary Care at [facility] [place] [address] [place], [place]  [address] [phone] phone [phone] fax

## 2018-01-10 ENCOUNTER — Other Ambulatory Visit: Payer: Self-pay

## 2018-01-10 ENCOUNTER — Encounter: Payer: Self-pay | Admitting: Family Medicine

## 2018-01-10 ENCOUNTER — Ambulatory Visit: Payer: Medicare Other | Admitting: Family Medicine

## 2018-01-10 VITALS — BP 140/82 | HR 78 | Temp 98.0°F | Resp 18 | Ht 63.5 in | Wt 152.0 lb

## 2018-01-10 DIAGNOSIS — Z6826 Body mass index (BMI) 26.0-26.9, adult: Secondary | ICD-10-CM

## 2018-01-10 DIAGNOSIS — E785 Hyperlipidemia, unspecified: Secondary | ICD-10-CM | POA: Diagnosis not present

## 2018-01-10 DIAGNOSIS — I10 Essential (primary) hypertension: Secondary | ICD-10-CM | POA: Diagnosis not present

## 2018-01-10 DIAGNOSIS — R911 Solitary pulmonary nodule: Secondary | ICD-10-CM | POA: Diagnosis not present

## 2018-01-10 DIAGNOSIS — C4499 Other specified malignant neoplasm of skin, unspecified: Secondary | ICD-10-CM

## 2018-01-10 MED ORDER — HYDROCHLOROTHIAZIDE 12.5 MG PO TABS: 12.5000 mg | ORAL_TABLET | Freq: Every day | ORAL | 1 refills | Status: DC

## 2018-01-10 NOTE — Patient Instructions (Signed)
     IF you received an x-ray today, you will receive an invoice from [facility]. Please contact [facility] at [phone] with questions or concerns regarding your invoice.   IF you received labwork today, you will receive an invoice from [facility]. Please contact [facility] at [phone] with questions or concerns regarding your invoice.   Our billing staff will not be able to assist you with questions regarding bills from these companies.  You will be contacted with the lab results as soon as they are available. The fastest way to get your results is to activate your My Chart account. Instructions are located on the last page of this paperwork. If you have not heard from us regarding the results in 2 weeks, please contact this office.     

## 2018-01-11 LAB — COMPREHENSIVE METABOLIC PANEL
A/G RATIO: 2.1 (ref 1.2–2.2)
ALBUMIN: 4.8 g/dL (ref 3.6–4.8)
ALT: 13 IU/L (ref 0–32)
AST: 17 IU/L (ref 0–40)
Alkaline Phosphatase: 85 IU/L (ref 39–117)
BILIRUBIN TOTAL: 0.4 mg/dL (ref 0.0–1.2)
BUN / CREAT RATIO: 17 (ref 12–28)
BUN: 14 mg/dL (ref 8–27)
CALCIUM: 9.4 mg/dL (ref 8.7–10.3)
CHLORIDE: 91 mmol/L — AB (ref 96–106)
CO2: 24 mmol/L (ref 20–29)
CREATININE: 0.83 mg/dL (ref 0.57–1.00)
GFR calc Af Amer: 84 mL/min/{1.73_m2} (ref >59)
GFR calc non Af Amer: 73 mL/min/{1.73_m2} (ref >59)
GLOBULIN, TOTAL: 2.3 g/dL (ref 1.5–4.5)
Glucose: 61 mg/dL — ABNORMAL LOW (ref 65–99)
Potassium: 4.1 mmol/L (ref 3.5–5.2)
SODIUM: 132 mmol/L — AB (ref 134–144)
Total Protein: 7.1 g/dL (ref 6.0–8.5)

## 2018-02-02 ENCOUNTER — Telehealth: Payer: Self-pay | Admitting: Family Medicine

## 2018-02-02 NOTE — Telephone Encounter (Signed)
Pt. Came into office to request status of Medical records request put in on 01/10/18. Pt. Informed request had been processed and faxed over to request office. Confirmed it was the correct office with pt. Pt. Requests medical records be resent and when resent the pt requested to be called at their home number. Pt. Gave consent for voice mail be recorded on that number.

## 2018-02-05 NOTE — Telephone Encounter (Signed)
Medical records were resent last week to [facility]

## 2018-02-15 ENCOUNTER — Other Ambulatory Visit: Payer: Self-pay | Admitting: Family Medicine

## 2018-02-15 DIAGNOSIS — I10 Essential (primary) hypertension: Secondary | ICD-10-CM

## 2018-02-15 DIAGNOSIS — I7090 Unspecified atherosclerosis: Secondary | ICD-10-CM

## 2018-02-15 DIAGNOSIS — J449 Chronic obstructive pulmonary disease, unspecified: Secondary | ICD-10-CM

## 2018-05-01 ENCOUNTER — Ambulatory Visit
Admission: RE | Admit: 2018-05-01 | Discharge: 2018-05-01 | Disposition: A | Discharge status: Home / Self Care | Payer: Medicare Other | Source: Ambulatory Visit | Attending: Family Medicine | Admitting: Family Medicine

## 2018-05-01 DIAGNOSIS — I7090 Unspecified atherosclerosis: Secondary | ICD-10-CM

## 2018-05-01 DIAGNOSIS — I10 Essential (primary) hypertension: Secondary | ICD-10-CM

## 2018-05-01 DIAGNOSIS — J449 Chronic obstructive pulmonary disease, unspecified: Secondary | ICD-10-CM

## 2018-05-17 ENCOUNTER — Other Ambulatory Visit: Payer: Self-pay | Admitting: Family Medicine

## 2018-05-17 DIAGNOSIS — I714 Abdominal aortic aneurysm, without rupture, unspecified: Secondary | ICD-10-CM

## 2018-05-21 ENCOUNTER — Other Ambulatory Visit (HOSPITAL_COMMUNITY): Payer: Self-pay | Admitting: Family Medicine

## 2018-05-21 DIAGNOSIS — R931 Abnormal findings on diagnostic imaging of heart and coronary circulation: Secondary | ICD-10-CM

## 2018-05-22 ENCOUNTER — Other Ambulatory Visit: Payer: Self-pay | Admitting: Family Medicine

## 2018-05-22 ENCOUNTER — Other Ambulatory Visit: Payer: Self-pay

## 2018-05-22 ENCOUNTER — Ambulatory Visit (HOSPITAL_BASED_OUTPATIENT_CLINIC_OR_DEPARTMENT_OTHER): Payer: Medicare Other

## 2018-05-22 ENCOUNTER — Ambulatory Visit (HOSPITAL_COMMUNITY)
Admission: RE | Admit: 2018-05-22 | Discharge: 2018-05-22 | Disposition: A | Discharge status: Home / Self Care | Payer: Medicare Other | Source: Ambulatory Visit | Attending: Internal Medicine | Admitting: Internal Medicine

## 2018-05-22 DIAGNOSIS — I714 Abdominal aortic aneurysm, without rupture, unspecified: Secondary | ICD-10-CM

## 2018-05-22 DIAGNOSIS — I700 Atherosclerosis of aorta: Secondary | ICD-10-CM | POA: Diagnosis present

## 2018-05-22 DIAGNOSIS — R931 Abnormal findings on diagnostic imaging of heart and coronary circulation: Secondary | ICD-10-CM | POA: Diagnosis not present

## 2018-06-19 ENCOUNTER — Telehealth (HOSPITAL_COMMUNITY): Payer: Self-pay

## 2018-06-19 ENCOUNTER — Other Ambulatory Visit: Payer: Self-pay | Admitting: *Deleted

## 2018-06-19 ENCOUNTER — Ambulatory Visit: Payer: Medicare Other | Admitting: Cardiovascular Disease

## 2018-06-19 ENCOUNTER — Encounter: Payer: Self-pay | Admitting: Cardiovascular Disease

## 2018-06-19 VITALS — BP 113/68 | HR 61 | Ht 63.0 in | Wt 159.4 lb

## 2018-06-19 DIAGNOSIS — R0602 Shortness of breath: Secondary | ICD-10-CM

## 2018-06-19 DIAGNOSIS — E785 Hyperlipidemia, unspecified: Secondary | ICD-10-CM

## 2018-06-19 DIAGNOSIS — I10 Essential (primary) hypertension: Secondary | ICD-10-CM | POA: Insufficient documentation

## 2018-06-19 DIAGNOSIS — J432 Centrilobular emphysema: Secondary | ICD-10-CM | POA: Insufficient documentation

## 2018-06-19 DIAGNOSIS — R0600 Dyspnea, unspecified: Secondary | ICD-10-CM

## 2018-06-19 DIAGNOSIS — I2510 Atherosclerotic heart disease of native coronary artery without angina pectoris: Secondary | ICD-10-CM | POA: Diagnosis not present

## 2018-06-19 DIAGNOSIS — R0609 Other forms of dyspnea: Secondary | ICD-10-CM

## 2018-06-19 HISTORY — DX: Other forms of dyspnea: R06.09

## 2018-06-19 HISTORY — DX: Dyspnea, unspecified: R06.00

## 2018-06-19 HISTORY — DX: Essential (primary) hypertension: I10

## 2018-06-19 HISTORY — DX: Atherosclerotic heart disease of native coronary artery without angina pectoris: I25.10

## 2018-06-19 MED ORDER — NEBIVOLOL HCL 2.5 MG PO TABS: 2.5000 mg | ORAL_TABLET | Freq: Every day | ORAL | 3 refills | Status: DC

## 2018-06-19 MED ORDER — ROSUVASTATIN CALCIUM 20 MG PO TABS: 20.0000 mg | ORAL_TABLET | Freq: Every day | ORAL | 1 refills | Status: DC

## 2018-06-19 NOTE — Patient Instructions (Addendum)
Medication Instructions:  INCREASE YOUR ROSUVASTATIN TO 20 MG DAILY   If you need a refill on your cardiac medications before your next appointment, please call your pharmacy.   Lab work: FASTING LP/CMET IN 2 MONTHS  If you have labs (blood work) drawn today and your tests are completely normal, you will receive your results only by: Marland Kitchen MyChart Message (if you have MyChart) OR . A paper copy in the mail If you have any lab test that is abnormal or we need to change your treatment, we will call you to review the results.  Testing/Procedures: Your physician has requested that you have an exercise tolerance test. For further information please visit [email or web address]. Please also follow instruction sheet, as given.  HOLD YOUR BYSTOLIC DAY BEFORE AND MORNING OF TREADMILL   Follow-Up: At [facility], you and your health needs are our priority.  As part of our continuing mission to provide you with exceptional heart care, we have created designated Provider Care Teams.  These Care Teams include your primary Cardiologist (physician) and Advanced Practice Providers (APPs -  Physician Assistants and Nurse Practitioners) who all work together to provide you with the care you need, when you need it. You will need a follow up appointment in 12 months.  Please call our office 2 months in advance to schedule this appointment.  You may see DR [facility]  or one of the following Advanced Practice Providers on your designated Care Team:   Kerin Ransom, PA-C Roby Lofts, [place] . Sande Rives, PA-C    You have been referred to [facility]. IF YOU DO NOT HEAR FROM THEM BY MID NEXT WEEK YOU CAN CALL THEM DIRECTLY AT [phone]   Exercise Stress Electrocardiogram An exercise stress electrocardiogram is a test to check how blood flows to your heart. It is done to find areas of poor blood flow. You will need to walk on a treadmill for this test. The electrocardiogram will record your heartbeat  when you are at rest and when you are exercising. What happens before the procedure?  Do not have drinks with caffeine or foods with caffeine for 24 hours before the test, or as told by your doctor. This includes coffee, tea (even decaf tea), sodas, chocolate, and cocoa.  Follow your doctor's instructions about eating and drinking before the test.  Ask your doctor what medicines you should or should not take before the test. Take your medicines with water unless told by your doctor not to.  If you use an inhaler, bring it with you to the test.  Bring a snack to eat after the test.  Do not  smoke for 4 hours before the test.  Do not put lotions, powders, creams, or oils on your chest before the test.  Wear comfortable shoes and clothing. What happens during the procedure?  You will have patches put on your chest. Small areas of your chest may need to be shaved. Wires will be connected to the patches.  Your heart rate will be watched while you are resting and while you are exercising.  You will walk on the treadmill. The treadmill will slowly get faster to raise your heart rate.  The test will take about 1-2 hours. What happens after the procedure?  Your heart rate and blood pressure will be watched after the test.  You may return to your normal diet, activities, and medicines or as told by your doctor. This information is not intended to replace advice given to you by  your health care provider. Make sure you discuss any questions you have with your health care provider. Document Released: 12/14/2007 Document Revised: 02/24/2016 Document Reviewed: 03/04/2013 Elsevier Interactive Patient Education  [employer].

## 2018-06-19 NOTE — Telephone Encounter (Signed)
Encounter complete. 

## 2018-06-19 NOTE — Progress Notes (Signed)
Cardiology Office Note   Date:  06/19/2018   ID:  Candice Saunders, Candice Saunders September 17, 1950, MRN [id number]  PCP:  Candice Rasmussen, MD  Cardiologist:   Skeet Latch, MD   Chief Complaint  Patient presents with  . New Patient (Initial Visit)  . Shortness of Breath    frequently      History of Present Illness: Candice Saunders is a 67 y.o. female with hypertension, hyperlipidemia, COPD, and prior tobacco abuse who is being seen today for the evaluation of atherosclerosis at the request of Candice Rasmussen, MD.  Candice Saunders had a chest CT for lung cancer screening on 05/01/2018.  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  She also had pulmonary nodules, emphysema and COPD.  She was referred to cardiology for further evaluation.  Candice Saunders has been feeling well.  She goes hiking every 3 or 4 weeks.  She notices that she does get short of breath when going uphill.  She also walks in her neighborhood twice per week and does a senior fitness class twice per week.  She has no exertional chest pain or shortness of breath with these activities.  She has not experienced any lower extremity edema, orthopnea, or PND.  She quit smoking 15 years ago after a greater than 30-pack-year history.  After her PCP noted that she had atherosclerosis of the aorta and her coronaries she was started on rosuvastatin 2.5 mg daily.  She has been tolerating this well.  She is mindful of her diet and has been on weight watchers for years.  She limits red meat to once per month.  She does eat cheese, milk, and butter.  She does a good job with portion control.    Past Medical History:  Diagnosis Date  . Allergy   . Coronary artery calcification seen on CAT scan 06/19/2018  . Depression   . Diverticulosis   . Essential hypertension 06/19/2018  . Exertional dyspnea 06/19/2018  . Extra mammary Paget disease   . 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 Hx of colonic polyps 09/29/2011  . Hyperlipidemia   . Macular degeneration 10/09/2012   Candice Saunders/Retinal specialist  . Candice Saunders 09/29/2011  . Osteoporosis     Past Surgical History:  Procedure Laterality Date  . Admission  07/11/2002   GI bleeding due to polyp.    . COLONOSCOPY W/ POLYPECTOMY  11/09/2010   Buccini; every 2-3 years  . extramammary paget's resection perianal region 07/06/2014 [facility] Colorectal surgery/[facility]  07/06/2014  . EYE SURGERY     Cataract resection  . ROTATOR CUFF REPAIR  01/08/2005   Right     Current Outpatient Medications  Medication Sig Dispense Refill  . aspirin 81 MG tablet Take 81 mg by mouth daily.    . Cholecalciferol (VITAMIN D3) 3000 units TABS Take by mouth.    . hydrochlorothiazide (HYDRODIURIL) 12.5 MG tablet Take 1 tablet (12.5 mg total) by mouth daily. 90 tablet 1  . Multiple Vitamins-Minerals (PRESERVISION AREDS 2 PO) Take by mouth.    . nebivolol (BYSTOLIC) 2.5 MG tablet Take 2.5 mg by mouth daily.    . rosuvastatin (CRESTOR) 20 MG tablet Take 1 tablet (20 mg total) by mouth daily. 90 tablet 1   No current facility-administered medications for this visit.     Allergies:   Patient has no known allergies.    Social History:  The patient  reports that she quit smoking about 11 years ago. Her smoking use included cigarettes. She quit after 30.00 years of use. She has never used smokeless tobacco. She reports that she drinks about 18.0 standard drinks of alcohol per week. She reports that she does not use drugs.   Family History:  The patient's family history includes Alzheimer's disease in her mother; Breast cancer in her mother; Cancer in her maternal grandmother; Cancer (age of onset: 85) in her sister; Cancer (age of onset: 72) in her mother; Heart attack in her father; Heart disease (age of onset: 7) in her father; Heart failure in her father; Hyperlipidemia in her father and sister; Hypertension in her  father and sister; Kidney disease (age of onset: 3) in her father; Osteoporosis in her mother; Stroke in her mother; Stroke (age of onset: 49) in her brother.    ROS:  Please see the history of present illness.   Otherwise, review of systems are positive for none.   All other systems are reviewed and negative.    PHYSICAL EXAM: VS:  BP 113/68   Pulse 61   Ht 5\' 3"  (1.6 m)   Wt 159 lb 6.4 oz (72.3 kg)   BMI 28.24 kg/m  , BMI Body mass index is 28.24 kg/m. GENERAL:  Well appearing HEENT:  Pupils equal round and reactive, fundi not visualized, oral mucosa unremarkable NECK:  No jugular venous distention, waveform within normal limits, carotid upstroke brisk and symmetric, no bruits, no thyromegaly LUNGS:  Clear to auscultation bilaterally HEART:  RRR.  PMI not displaced or sustained,S1 and S2 within normal limits, no S3, no S4, no clicks, no rubs, no murmurs ABD:  Flat, positive bowel sounds normal in frequency in pitch, no bruits, no rebound, no guarding, no midline pulsatile mass, no hepatomegaly, no splenomegaly EXT:  2 plus pulses throughout, no edema, no cyanosis no clubbing SKIN:  No rashes no nodules NEURO:  Cranial nerves II through XII grossly intact, motor grossly intact throughout PSYCH:  Cognitively intact, oriented to person place and time    EKG:  EKG is ordered today. The ekg ordered today demonstrates sinus rhythm.  Rate 61 bpm.  Biatrial enlargement.    Recent Labs: 08/30/2017: Hemoglobin 13.9; Platelets 337; TSH 1.580 01/10/2018: ALT 13; BUN 14; Creatinine, Ser 0.83; Potassium 4.1; Sodium 132    Lipid Panel    Component Value Date/Time   CHOL 256 (H) 10/11/2017 1032   TRIG 70 10/11/2017 1032   HDL 100 10/11/2017 1032   CHOLHDL 2.6 10/11/2017 1032   CHOLHDL 2.1 01/26/2016 1111   VLDL 17 01/26/2016 1111   LDLCALC 142 (H) 10/11/2017 1032      Wt Readings from Last 3 Encounters:  06/19/18 159 lb 6.4 oz (72.3 kg)  01/10/18 152 lb (68.9 kg)  10/11/17 156 lb  (70.8 kg)      ASSESSMENT AND PLAN:  # Coronary calcification: # Hyperlipidemia: # Shortness of breath: [name] was noted to have calcification of the aorta and LCX.  She has no chest pain but does have exertional dyspnea when climbing hills.  We will get an ETT to assess for ischemia.  Continue aspirin and nebivolol.  Increase rosuvastatin to 20mg .  Check lipids/CMP in 2 months.  We will also refer her to pulmonology given the note of COPD/emphysema on her CT.   # Hypertension: BP well-controlled on HCTZ and nebivolol.   Current medicines are reviewed at length with the patient today.  The patient  does not have concerns regarding medicines.  The following changes have been made: Increase rosuvastatin  Labs/ tests ordered today include:   Orders Placed This Encounter  Procedures  . Lipid panel  . Comprehensive metabolic panel  . Ambulatory referral to Pulmonology  . Exercise Tolerance Test  . EKG 12-Lead    Disposition:   FU with [place] C. Oval Linsey, MD, [facility] in 1 year.    Signed, Sparkles Mcneely C. Oval Linsey, MD, [facility]  06/19/2018 10:04 AM    [place]

## 2018-06-21 ENCOUNTER — Ambulatory Visit (HOSPITAL_COMMUNITY)
Admission: RE | Admit: 2018-06-21 | Discharge: 2018-06-21 | Disposition: A | Discharge status: Home / Self Care | Payer: Medicare Other | Source: Ambulatory Visit | Attending: Cardiology | Admitting: Cardiology

## 2018-06-21 DIAGNOSIS — R0602 Shortness of breath: Secondary | ICD-10-CM

## 2018-06-21 LAB — EXERCISE TOLERANCE TEST
Estimated workload: 7 METS
Exercise duration (min): 5 min
Exercise duration (sec): 0 s
MPHR: 151 {beats}/min
Peak HR: 130 {beats}/min
Percent HR: 86 %
RPE: 19
Rest HR: 55 {beats}/min

## 2018-07-31 ENCOUNTER — Encounter: Payer: Self-pay | Admitting: Pulmonary Disease

## 2018-07-31 ENCOUNTER — Ambulatory Visit: Payer: Medicare Other | Admitting: Pulmonary Disease

## 2018-07-31 VITALS — BP 118/60 | HR 54 | Ht 63.0 in | Wt 157.0 lb

## 2018-07-31 DIAGNOSIS — R911 Solitary pulmonary nodule: Secondary | ICD-10-CM

## 2018-07-31 DIAGNOSIS — J432 Centrilobular emphysema: Secondary | ICD-10-CM

## 2018-07-31 NOTE — Assessment & Plan Note (Signed)
Not impressed with 2 mm nodule in the right upper lung.  Annual low-dose CT screening can be continued

## 2018-07-31 NOTE — Patient Instructions (Signed)
CT scan shows mild emphysema. Lung function is mildly decreased at 77%  Okay to increase aerobic activity

## 2018-07-31 NOTE — Progress Notes (Signed)
Subjective:    Patient ID: Candice Saunders, female    DOB: 03-19-1951, 68 y.o.   MRN: [id number]  HPI Chief Complaint  Patient presents with  . Pulm Consult    Referred by cardiologist Dr. Oval Linsey for abnormal CT scan. States her breathing has been ok.     68 year old remote smoker presents for evaluation of emphysema noted on CT scan She smoked about half pack per day starting as a teenager until she quit in 2007, more than 30 pack years.  She underwent lung cancer screening study in 2018 and again in 2019 which showed mild emphysema.  This also showed a 2 mm nodule in the right upper lobe.  There is also showed aortic atherosclerosis for which she underwent cardiology consultation, echo 05/2018 showed normal LV function and mild aortic calcification without stenosis.  She was referred to pulmonary for evaluation of her shortness of breath. She reports dyspnea on extreme exertion such as hiking or walking up an elevation.  She is able to undertake low impact aerobic [facility] a [facility].  She does not get short of breath on routine activities such as walking in the store or carrying groceries back to her car. Blood pressures well controlled on thiazide and Bystolic  She is a retired Teaching laboratory technician socially.  Spirometry showed mild airway obstruction with ratio of 69, FEV1 of 77% and FVC of 85%   I personally reviewed her CT scan    Past Medical History:  Diagnosis Date  . Allergy   . Coronary artery calcification seen on CAT scan 06/19/2018  . Depression   . Diverticulosis   . Essential hypertension 06/19/2018  . Exertional dyspnea 06/19/2018  . Extra mammary Paget disease   . 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 Hx of colonic polyps 09/29/2011  . Hyperlipidemia   . Macular degeneration 10/09/2012   John Matthews/Retinal specialist  .  Lebron Quam 09/29/2011  . Osteoporosis    Past Surgical History:  Procedure Laterality Date  . Admission  07/11/2002   GI bleeding due to polyp.    . COLONOSCOPY W/ POLYPECTOMY  11/09/2010   Buccini; every 2-3 years  . extramammary paget's resection perianal region 07/06/2014 [facility] Colorectal surgery/[facility]  07/06/2014  . EYE SURGERY     Cataract resection  . ROTATOR CUFF REPAIR  01/08/2005   Right    No Known Allergies  Social History   Socioeconomic History  . Marital status: Single    Spouse name: Not on file  . Number of children: 0  . Years of education: Not on file  . Highest education level: Not on file  Occupational History  . Occupation: retired    Comment: Teaching laboratory technician; retired in 2004.  Social Needs  . Financial resource strain: Not on file  . Food insecurity:    Worry: Not on file    Inability: Not on file  . Transportation needs:    Medical: Not on file    Non-medical: Not on file  Tobacco Use  . Smoking status: Former Smoker    Years: 30.00    Types: Cigarettes    Last attempt to quit: 08/26/2006    Years since quitting: 11.9  . Smokeless tobacco: Never Used  Substance and Sexual Activity  . Alcohol use: Yes    Alcohol/week: 18.0 standard drinks    Types: 18 Cans of beer  per week    Comment: drinks 2 beers daily; 4 beers on Friday and Saturdays.  . Drug use: No  . Sexual activity: Not Currently    Partners: Female    Comment: Same sex sexual partners  Lifestyle  . Physical activity:    Days per week: Not on file    Minutes per session: Not on file  . Stress: Not on file  Relationships  . Social connections:    Talks on phone: Not on file    Gets together: Not on file    Attends religious service: Not on file    Active member of club or organization: Not on file    Attends meetings of clubs or organizations: Not on file    Relationship status: Not on file  . Intimate partner violence:    Fear of current or ex partner: Not on file     Emotionally abused: Not on file    Physically abused: Not on file    Forced sexual activity: Not on file  Other Topics Concern  . Not on file  Social History Narrative   Marital status: single.  Dating x casually/seriously.   Seeing once weekly.      Children: none     Lives: with ex-partner since 2012; moving out in 10/2012.Lives alone in [place] in home.          Employment: retired since 2004; school psychologist x 30 years.        Tobacco: quit smoking 2004.       Alcohol:  beer on weekends.      Drugs: none      Exercise: no  Walking or hiking in 2018. Goal is 10,000 steps per day in 2018.      Seatbelt: 100%; no texting while driving. Flip phone in 2017.      Sunscreen:  Sporadic.      Guns: none.      Sexual activity: same sex partners.      ADLs: independent life; drives.      Advanced Directive:  Yes; Animal nutritionist.   FULL CODE.                      Family History  Problem Relation Age of Onset  . Alzheimer's disease Mother   . Cancer Mother 47       Breast cancer  . Osteoporosis Mother   . Stroke Mother   . Breast cancer Mother   . Heart disease Father 57       AMI, CHF  . Kidney disease Father 90       ESRD/HD  . Hyperlipidemia Father   . Hypertension Father   . Heart failure Father   . Heart attack Father   . Stroke Brother 73       TIA  . Hyperlipidemia Sister   . Hypertension Sister   . Cancer Sister 3       oral cancer  . Cancer Maternal Grandmother         Review of Systems   Constitutional: negative for anorexia, fevers and sweats  Eyes: negative for irritation, redness and visual disturbance  Ears, nose, mouth, throat, and face: negative for earaches, epistaxis, nasal congestion and sore throat  Respiratory: negative for cough, dyspnea on exertion, sputum and wheezing  Cardiovascular: negative for chest pain, dyspnea, lower extremity edema, orthopnea, palpitations and syncope  Gastrointestinal: negative for abdominal pain,  constipation, diarrhea, melena, nausea and vomiting  Genitourinary:negative  for dysuria, frequency and hematuria  Hematologic/lymphatic: negative for bleeding, easy bruising and lymphadenopathy  Musculoskeletal:negative for arthralgias, muscle weakness and stiff joints  Neurological: negative for coordination problems, gait problems, headaches and weakness  Endocrine: negative for diabetic symptoms including polydipsia, polyuria and weight loss     Objective:   Physical Exam   Gen. Pleasant, well-nourished, in no distress, normal affect ENT - no pallor,icterus, no post nasal drip Neck: No JVD, no thyromegaly, no carotid bruits Lungs: no use of accessory muscles, no dullness to percussion, clear without rales or rhonchi  Cardiovascular: Rhythm regular, heart sounds  normal, no murmurs or gallops, no peripheral edema Abdomen: soft and non-tender, no hepatosplenomegaly, BS normal. Musculoskeletal: No deformities, no cyanosis or clubbing Neuro:  alert, non focal        Assessment & Plan:

## 2018-07-31 NOTE — Assessment & Plan Note (Signed)
Personally reviewed images, we discussed that she has mild emphysema and mild airway obstruction.  This should not limit her aerobic exercise.  She does not need medications for this degree of airway obstruction at this time. She will follow-up in 1 year.  She will call us if she has frequent chest colds  Extensive counseling regarding stage of disease

## 2018-11-09 ENCOUNTER — Encounter (INDEPENDENT_AMBULATORY_CARE_PROVIDER_SITE_OTHER): Payer: Medicare Other | Admitting: Ophthalmology

## 2018-11-21 ENCOUNTER — Encounter (INDEPENDENT_AMBULATORY_CARE_PROVIDER_SITE_OTHER): Payer: Medicare Other | Admitting: Ophthalmology

## 2019-01-31 LAB — COMPREHENSIVE METABOLIC PANEL
ALBUMIN: 4.3 g/dL (ref 3.8–4.8)
ALT: 20 IU/L (ref 0–32)
AST: 21 IU/L (ref 0–40)
Albumin/Globulin Ratio: 2 (ref 1.2–2.2)
Alkaline Phosphatase: 87 IU/L (ref 39–117)
BILIRUBIN TOTAL: 0.4 mg/dL (ref 0.0–1.2)
BUN / CREAT RATIO: 15 (ref 12–28)
BUN: 12 mg/dL (ref 8–27)
CHLORIDE: 92 mmol/L — AB (ref 96–106)
CO2: 23 mmol/L (ref 20–29)
CREATININE: 0.8 mg/dL (ref 0.57–1.00)
Calcium: 9.3 mg/dL (ref 8.7–10.3)
GFR, EST AFRICAN AMERICAN: 87 mL/min/{1.73_m2} (ref >59)
GFR, EST NON AFRICAN AMERICAN: 75 mL/min/{1.73_m2} (ref >59)
Globulin, Total: 2.2 g/dL (ref 1.5–4.5)
Glucose: 87 mg/dL (ref 65–99)
Potassium: 4.4 mmol/L (ref 3.5–5.2)
SODIUM: 132 mmol/L — AB (ref 134–144)
Total Protein: 6.5 g/dL (ref 6.0–8.5)

## 2019-01-31 LAB — LIPID PANEL
Chol/HDL Ratio: 2.1 ratio (ref 0.0–4.4)
Cholesterol, Total: 161 mg/dL (ref 100–199)
HDL: 77 mg/dL (ref >39)
LDL CALC: 69 mg/dL (ref 0–99)
TRIGLYCERIDES: 77 mg/dL (ref 0–149)
VLDL CHOLESTEROL CAL: 15 mg/dL (ref 5–40)

## 2019-02-18 ENCOUNTER — Encounter: Payer: Self-pay | Admitting: Podiatry

## 2019-02-18 ENCOUNTER — Other Ambulatory Visit: Payer: Self-pay

## 2019-02-18 ENCOUNTER — Ambulatory Visit (INDEPENDENT_AMBULATORY_CARE_PROVIDER_SITE_OTHER): Payer: Medicare Other

## 2019-02-18 ENCOUNTER — Ambulatory Visit: Payer: Medicare Other | Admitting: Podiatry

## 2019-02-18 VITALS — BP 116/65 | HR 65 | Temp 97.4°F | Resp 16

## 2019-02-18 DIAGNOSIS — M722 Plantar fascial fibromatosis: Secondary | ICD-10-CM | POA: Diagnosis not present

## 2019-02-18 NOTE — Progress Notes (Signed)
   Subjective:    Patient ID: Candice Saunders, female    DOB: 1951-01-02, 68 y.o.   MRN: [id number]  HPI    Review of Systems  All other systems reviewed and are negative.      Objective:   Physical Exam        Assessment & Plan:

## 2019-02-18 NOTE — Patient Instructions (Signed)

## 2019-02-19 NOTE — Progress Notes (Signed)
Subjective:   Patient ID: Candice Saunders, female   DOB: 68 y.o.   MRN: [id number]   HPI Patient states has had a lot of pain in the bottom of both heels with the left being worse than the right.  States it is been getting gradually worse and making it hard to be active and she is tried different modalities at this time without relief of symptoms   Review of Systems  All other systems reviewed and are negative.       Objective:  Physical Exam Vitals signs and nursing note reviewed.  Constitutional:      Appearance: She is well-developed.  Pulmonary:     Effort: Pulmonary effort is normal.  Musculoskeletal: Normal range of motion.  Skin:    General: Skin is warm.  Neurological:     Mental Status: She is alert.     Neurovascular status intact muscle strength is adequate range of motion within normal limits.  Patient is found to have exquisite discomfort plantar heel left over right at the insertion tendon calcaneus with moderate depression of the arch and mild equinus condition noted bilateral.  Patient is found to have good digital perfusion and is well oriented x3     Assessment:  Acute plantar fasciitis bilateral with inflammation fluid of the medial band     Plan:  H&P x-rays reviewed today I injected the plantar fascial bilateral 3 mg Kenalog 5 mg Xylocaine and applied fascial brace with instructions on usage.  Patient was given instructions for shoe gear type usage and will be seen back to reevaluate  X-rays indicate that there is spur formation with no indication of stress fracture or arthritis

## 2019-03-02 ENCOUNTER — Other Ambulatory Visit: Payer: Self-pay | Admitting: Cardiovascular Disease

## 2019-03-04 ENCOUNTER — Other Ambulatory Visit: Payer: Self-pay

## 2019-03-04 ENCOUNTER — Encounter: Payer: Self-pay | Admitting: Podiatry

## 2019-03-04 ENCOUNTER — Encounter (INDEPENDENT_AMBULATORY_CARE_PROVIDER_SITE_OTHER): Payer: Medicare Other | Admitting: Ophthalmology

## 2019-03-04 ENCOUNTER — Ambulatory Visit: Payer: Medicare Other | Admitting: Podiatry

## 2019-03-04 VITALS — Temp 97.2°F

## 2019-03-04 DIAGNOSIS — I10 Essential (primary) hypertension: Secondary | ICD-10-CM | POA: Diagnosis not present

## 2019-03-04 DIAGNOSIS — H43813 Vitreous degeneration, bilateral: Secondary | ICD-10-CM

## 2019-03-04 DIAGNOSIS — M722 Plantar fascial fibromatosis: Secondary | ICD-10-CM

## 2019-03-04 DIAGNOSIS — H353131 Nonexudative age-related macular degeneration, bilateral, early dry stage: Secondary | ICD-10-CM | POA: Diagnosis not present

## 2019-03-04 DIAGNOSIS — H35033 Hypertensive retinopathy, bilateral: Secondary | ICD-10-CM

## 2019-03-04 MED ORDER — ROSUVASTATIN CALCIUM 20 MG PO TABS: ORAL_TABLET | ORAL | 1 refills | Status: DC

## 2019-03-04 NOTE — Progress Notes (Signed)
Subjective:   Patient ID: Candice Saunders, female   DOB: 68 y.o.   MRN: [id number]   HPI Patient presents stating that her feet feel 90% better and the braces at times are bothersome   ROS      Objective:  Physical Exam  Neurovascular status intact with patient found to have feet that are improving quite a bit with discomfort in the plantar heel bilateral that is improved by about 80 to 90% with mild discomfort upon deep palpation     Assessment:  Plantar fasciitis bilateral that continues to improve     Plan:  Reviewed condition and at this point discussed continued conservative care consisting of anti-inflammatories physical therapy shoe gear modification and heel lifts.  I reviewed this at great length going over all of these individually and what to do if symptoms were to recur and will be seen back on an as-needed basis

## 2019-05-09 ENCOUNTER — Other Ambulatory Visit: Payer: Self-pay

## 2019-05-09 ENCOUNTER — Encounter: Payer: Self-pay | Admitting: Podiatry

## 2019-05-09 ENCOUNTER — Ambulatory Visit: Payer: Medicare Other | Admitting: Podiatry

## 2019-05-09 DIAGNOSIS — M722 Plantar fascial fibromatosis: Secondary | ICD-10-CM | POA: Diagnosis not present

## 2019-05-10 NOTE — Progress Notes (Signed)
Subjective:   Patient ID: Candice Saunders, female   DOB: 68 y.o.   MRN: [id number]   HPI Patient states that her pain is starting to recur to a relative significant nature and that trying to wear braces has not improved her.  States that she is not able to do the activities she wants and she needs to be active and is frustrated by this    ROS      Objective:  Physical Exam  Neurovascular status intact with patient's plantar inflammation noted to be at the insertion of the tendon into the calcaneus bilateral with fluid buildup with flatfoot deformity noted bilateral     Assessment:  Acute fasciitis-like symptoms with chronic nature to condition and patient who wants to be more active but cannot because of the pain     Plan:  H&P educated her and today I did do sterile prep and reinjected the fascia bilateral 3 mg Kenalog 5 mg Xylocaine and discussed shoes with lifts of the heel and support.  I am also referring to [facility] orthotist for orthotics to try to help her with her activities and I do want her to have a softer variety that will work well with her walking shoes and running shoes

## 2019-05-15 ENCOUNTER — Other Ambulatory Visit: Payer: Self-pay

## 2019-05-15 ENCOUNTER — Other Ambulatory Visit: Payer: Medicare Other | Admitting: Orthotics

## 2019-05-15 DIAGNOSIS — M722 Plantar fascial fibromatosis: Secondary | ICD-10-CM

## 2019-05-21 ENCOUNTER — Other Ambulatory Visit: Payer: Self-pay | Admitting: Family Medicine

## 2019-05-21 ENCOUNTER — Other Ambulatory Visit: Payer: Self-pay

## 2019-05-21 DIAGNOSIS — Z20822 Contact with and (suspected) exposure to covid-19: Secondary | ICD-10-CM

## 2019-05-21 DIAGNOSIS — J449 Chronic obstructive pulmonary disease, unspecified: Secondary | ICD-10-CM

## 2019-05-21 DIAGNOSIS — I10 Essential (primary) hypertension: Secondary | ICD-10-CM

## 2019-05-23 LAB — NOVEL CORONAVIRUS, NAA: SARS-CoV-2, NAA: NOT DETECTED

## 2019-06-05 ENCOUNTER — Other Ambulatory Visit: Payer: Self-pay

## 2019-06-05 DIAGNOSIS — Z20822 Contact with and (suspected) exposure to covid-19: Secondary | ICD-10-CM

## 2019-06-06 LAB — NOVEL CORONAVIRUS, NAA: SARS-CoV-2, NAA: NOT DETECTED

## 2019-06-14 ENCOUNTER — Other Ambulatory Visit: Payer: Medicare Other | Admitting: Orthotics

## 2019-06-14 ENCOUNTER — Other Ambulatory Visit: Payer: Self-pay

## 2019-06-17 ENCOUNTER — Ambulatory Visit
Admission: RE | Admit: 2019-06-17 | Discharge: 2019-06-17 | Disposition: A | Discharge status: Home / Self Care | Payer: Medicare Other | Source: Ambulatory Visit | Attending: Family Medicine | Admitting: Family Medicine

## 2019-06-17 DIAGNOSIS — J449 Chronic obstructive pulmonary disease, unspecified: Secondary | ICD-10-CM

## 2019-06-17 DIAGNOSIS — I10 Essential (primary) hypertension: Secondary | ICD-10-CM

## 2019-06-17 MED ORDER — IOPAMIDOL (ISOVUE-300) INJECTION 61%
75.0000 mL | Freq: Once | INTRAVENOUS | Status: AC | PRN
  Administered 2019-06-17: 75 mL via INTRAVENOUS

## 2019-06-26 ENCOUNTER — Ambulatory Visit: Payer: Medicare Other | Admitting: Cardiovascular Disease

## 2019-07-06 ENCOUNTER — Other Ambulatory Visit: Payer: Self-pay | Admitting: Cardiovascular Disease

## 2019-07-23 ENCOUNTER — Other Ambulatory Visit (HOSPITAL_COMMUNITY): Payer: Self-pay | Admitting: Family Medicine

## 2019-07-23 DIAGNOSIS — R130 Aphagia: Secondary | ICD-10-CM

## 2019-07-23 DIAGNOSIS — G43B0 Ophthalmoplegic migraine, not intractable: Secondary | ICD-10-CM

## 2019-07-23 DIAGNOSIS — R55 Syncope and collapse: Secondary | ICD-10-CM

## 2019-07-25 ENCOUNTER — Other Ambulatory Visit: Payer: Self-pay

## 2019-07-25 ENCOUNTER — Ambulatory Visit (HOSPITAL_COMMUNITY)
Admission: RE | Admit: 2019-07-25 | Discharge: 2019-07-25 | Disposition: A | Discharge status: Home / Self Care | Payer: Medicare PPO | Source: Ambulatory Visit | Attending: Cardiology | Admitting: Cardiology

## 2019-07-25 DIAGNOSIS — R55 Syncope and collapse: Secondary | ICD-10-CM | POA: Diagnosis present

## 2019-07-29 ENCOUNTER — Ambulatory Visit (HOSPITAL_COMMUNITY): Payer: Medicare PPO | Attending: Cardiovascular Disease

## 2019-07-29 ENCOUNTER — Other Ambulatory Visit: Payer: Self-pay

## 2019-07-29 DIAGNOSIS — I119 Hypertensive heart disease without heart failure: Secondary | ICD-10-CM | POA: Diagnosis not present

## 2019-07-29 DIAGNOSIS — I351 Nonrheumatic aortic (valve) insufficiency: Secondary | ICD-10-CM | POA: Insufficient documentation

## 2019-07-29 DIAGNOSIS — Z87891 Personal history of nicotine dependence: Secondary | ICD-10-CM | POA: Insufficient documentation

## 2019-07-29 DIAGNOSIS — G43B0 Ophthalmoplegic migraine, not intractable: Secondary | ICD-10-CM | POA: Insufficient documentation

## 2019-07-29 DIAGNOSIS — I2510 Atherosclerotic heart disease of native coronary artery without angina pectoris: Secondary | ICD-10-CM | POA: Diagnosis not present

## 2019-07-29 DIAGNOSIS — R130 Aphagia: Secondary | ICD-10-CM | POA: Insufficient documentation

## 2019-07-29 DIAGNOSIS — E785 Hyperlipidemia, unspecified: Secondary | ICD-10-CM | POA: Insufficient documentation

## 2019-08-06 ENCOUNTER — Other Ambulatory Visit: Payer: Self-pay | Admitting: Cardiovascular Disease

## 2019-08-09 ENCOUNTER — Ambulatory Visit: Payer: Medicare PPO

## 2019-08-14 ENCOUNTER — Ambulatory Visit: Payer: Medicare Other | Admitting: Cardiovascular Disease

## 2019-08-17 ENCOUNTER — Ambulatory Visit: Payer: Medicare PPO | Attending: Internal Medicine

## 2019-08-17 DIAGNOSIS — Z23 Encounter for immunization: Secondary | ICD-10-CM

## 2019-08-17 NOTE — Progress Notes (Signed)
   Covid-19 Vaccination Clinic  Name:  Candice Saunders    MRN: [id number] DOB: 01-Mar-1951  08/17/2019  Candice Saunders was observed post Covid-19 immunization for 15 minutes without incidence. She was provided with Vaccine Information Sheet and instruction to access the V-Safe system.   Candice Saunders was instructed to call 911 with any severe reactions post vaccine: Marland Kitchen Difficulty breathing  . Swelling of your face and throat  . A fast heartbeat  . A bad rash all over your body  . Dizziness and weakness    Immunizations Administered    Name Date Dose VIS Date Route   Pfizer COVID-19 Vaccine 08/17/2019 12:53 PM 0.3 mL 06/21/2019 Intramuscular   Manufacturer: [place]   Lot: [id number]   [place]: [id number]

## 2019-08-21 ENCOUNTER — Encounter: Payer: Self-pay | Admitting: Cardiovascular Disease

## 2019-08-21 ENCOUNTER — Other Ambulatory Visit: Payer: Self-pay

## 2019-08-21 ENCOUNTER — Ambulatory Visit: Payer: Medicare PPO | Admitting: Cardiovascular Disease

## 2019-08-21 VITALS — BP 116/70 | HR 63 | Temp 96.3°F | Ht 62.0 in | Wt 169.0 lb

## 2019-08-21 DIAGNOSIS — I10 Essential (primary) hypertension: Secondary | ICD-10-CM | POA: Diagnosis not present

## 2019-08-21 DIAGNOSIS — E785 Hyperlipidemia, unspecified: Secondary | ICD-10-CM

## 2019-08-21 DIAGNOSIS — Z5181 Encounter for therapeutic drug level monitoring: Secondary | ICD-10-CM | POA: Diagnosis not present

## 2019-08-21 DIAGNOSIS — I6529 Occlusion and stenosis of unspecified carotid artery: Secondary | ICD-10-CM | POA: Insufficient documentation

## 2019-08-21 DIAGNOSIS — E871 Hypo-osmolality and hyponatremia: Secondary | ICD-10-CM

## 2019-08-21 DIAGNOSIS — I6523 Occlusion and stenosis of bilateral carotid arteries: Secondary | ICD-10-CM | POA: Diagnosis not present

## 2019-08-21 DIAGNOSIS — G43109 Migraine with aura, not intractable, without status migrainosus: Secondary | ICD-10-CM

## 2019-08-21 HISTORY — DX: Occlusion and stenosis of unspecified carotid artery: I65.29

## 2019-08-21 HISTORY — DX: Hypo-osmolality and hyponatremia: E87.1

## 2019-08-21 HISTORY — DX: Migraine with aura, not intractable, without status migrainosus: G43.109

## 2019-08-21 MED ORDER — AMLODIPINE BESYLATE 2.5 MG PO TABS: 2.5000 mg | ORAL_TABLET | Freq: Every day | ORAL | 3 refills | Status: DC

## 2019-08-21 NOTE — Progress Notes (Signed)
Cardiology Office Note   Date:  08/21/2019   ID:  Candice Saunders, Candice Saunders September 14, 1950, MRN [id number]  PCP:  Hayden Rasmussen, MD  Cardiologist:   Skeet Latch, MD   Chief Complaint  Patient presents with  . Follow-up    1 year.      History of Present Illness: IRYNA CORBO is a 69 y.o. female with hypertension, hyperlipidemia, mild carotid stenois, asymptomatic coronary calcification, COPD, and prior tobacco abuse here for follow up.  She was initially seen 06/2018 for the evaluation of atherosclerosis at the request of Hayden Rasmussen, MD.  Ms. Rausch had a chest CT for lung cancer screening on 05/01/2018.  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  She also had pulmonary nodules, emphysema and COPD.  She was referred to cardiology for further evaluation.  Ms. Grindel was feeling well but did have some shortness of breath when walking up hills.  Given these findings she was referred for an ETT that was negative for ischemia.  She achieved 7 METS on a Bruce protocol.  She was started on aspirin and rosuvastatin.  Since then she has felt generally well.  However she did have an episode of transient aphasia.  She saw Dr. Darron Doom and was referred for carotid Dopplers that revealed mild stenosis bilaterally.  Echocardiogram 07/2019 revealed LVEF 60 to 65% with mild LVH and aortic sclerosis without stenosis.  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  She is scheduled to see a neurologist soon.  She has not been able to exercise much lately due to plantar fasciitis.  She denies exertional chest pain or shortness of breath.  She has noticed that on her labs her sodium has been running in the 130s for the last year or two.   Past Medical History:  Diagnosis Date  . Allergy   . Carotid stenosis 08/21/2019  . Coronary artery calcification seen on CAT scan 06/19/2018  . Depression   . Diverticulosis   . Essential hypertension 06/19/2018  .  Exertional dyspnea 06/19/2018  . Extra mammary Paget disease   . 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 Hx of colonic polyps 09/29/2011  . Hyperlipidemia   . Hyponatremia 08/21/2019  . Macular degeneration 10/09/2012   John Matthews/Retinal specialist  . Ocular migraine 08/21/2019  . Osteopenia 09/29/2011  . Osteoporosis     Past Surgical History:  Procedure Laterality Date  . Admission  07/11/2002   GI bleeding due to polyp.    . COLONOSCOPY W/ POLYPECTOMY  11/09/2010   Buccini; every 2-3 years  . extramammary paget's resection perianal region 07/06/2014 [facility] Colorectal surgery/Hopkins  07/06/2014  . EYE SURGERY     Cataract resection  . ROTATOR CUFF REPAIR  01/08/2005   Right     Current Outpatient Medications  Medication Sig Dispense Refill  . aspirin 81 MG tablet Take 81 mg by mouth daily.    . Cholecalciferol (VITAMIN D3) 3000 units TABS Take by mouth.    . Multiple Vitamins-Minerals (PRESERVISION AREDS 2 PO) Take by mouth.    . nebivolol (BYSTOLIC) 2.5 MG tablet Take 1 tablet (2.5 mg total) by mouth daily. Patient needs to schedule an appointment 30 tablet 1  . rosuvastatin (CRESTOR) 20 MG tablet TAKE 1 TABLET BY MOUTH EVERY DAY 90 tablet 1  . amLODipine (NORVASC) 2.5 MG tablet Take 1 tablet (2.5 mg total) by mouth  daily. 90 tablet 3   No current facility-administered medications for this visit.    Allergies:   Patient has no known allergies.    Social History:  The patient  reports that she quit smoking about 12 years ago. Her smoking use included cigarettes. She quit after 30.00 years of use. She has never used smokeless tobacco. She reports current alcohol use of about 18.0 standard drinks of alcohol per week. She reports that she does not use drugs.   Family History:  The patient's family history includes Alzheimer's disease in her mother; Breast cancer in her mother; Cancer in her maternal  grandmother; Cancer (age of onset: 78) in her sister; Cancer (age of onset: 83) in her mother; Heart attack in her father; Heart disease (age of onset: 19) in her father; Heart failure in her father; Hyperlipidemia in her father and sister; Hypertension in her father and sister; Kidney disease (age of onset: 88) in her father; Osteoporosis in her mother; Stroke in her mother; Stroke (age of onset: 49) in her brother.    ROS:  Please see the history of present illness.   Otherwise, review of systems are positive for none.   All other systems are reviewed and negative.    PHYSICAL EXAM: VS:  BP 116/70 (BP Location: Left Arm, Patient Position: Sitting, Cuff Size: Normal)   Pulse 63   Temp (!) 96.3 F (35.7 C)   Ht 5\' 2"  (1.575 m)   Wt 169 lb (76.7 kg)   BMI 30.91 kg/m  , BMI Body mass index is 30.91 kg/m. GENERAL:  Well appearing HEENT:  Pupils equal round and reactive, fundi not visualized, oral mucosa unremarkable NECK:  No jugular venous distention, waveform within normal limits, carotid upstroke brisk and symmetric, no bruits, no thyromegaly LUNGS:  Clear to auscultation bilaterally HEART:  RRR.  PMI not displaced or sustained,S1 and S2 within normal limits, no S3, no S4, no clicks, no rubs, no murmurs ABD:  Flat, positive bowel sounds normal in frequency in pitch, no bruits, no rebound, no guarding, no midline pulsatile mass, no hepatomegaly, no splenomegaly EXT:  2 plus pulses throughout, no edema, no cyanosis no clubbing SKIN:  No rashes no nodules NEURO:  Cranial nerves II through XII grossly intact, motor grossly intact throughout PSYCH:  Cognitively intact, oriented to person place and time    EKG:  EKG is ordered today. The ekg ordered 06/19/18 demonstrates sinus rhythm.  Rate 61 bpm.  Biatrial enlargement.  08/21/19: Sinus rhythm.  Rate 63 bpm.    Carotid Doppler 07/25/19:  1-39% ICA stenosis bilaterally.   Echo 07/29/19: IMPRESSIONS    1. Left ventricular ejection  fraction, by visual estimation, is 60 to  65%. The left ventricle has normal function. There is mildly increased  left ventricular hypertrophy.  2. The left ventricle has no regional wall motion abnormalities.  3. Global right ventricle has normal systolic function.The right  ventricular size is normal. No increase in right ventricular wall  thickness.  4. Left atrial size was normal.  5. Right atrial size was normal.  6. The mitral valve is normal in structure. Trivial mitral valve  regurgitation. No evidence of mitral stenosis.  7. The tricuspid valve is normal in structure.  8. The tricuspid valve is normal in structure. Tricuspid valve  regurgitation is not demonstrated.  9. The aortic valve is tricuspid. Aortic valve regurgitation is mild.  Mild to moderate aortic valve sclerosis/calcification without any evidence  of aortic stenosis.  10.  Calcified left and non coronary cusps.  11. The pulmonic valve was normal in structure. Pulmonic valve  regurgitation is not visualized.  12. The inferior vena cava is normal in size with greater than 50%  respiratory variability, suggesting right atrial pressure of 3 mmHg.    Recent Labs: 01/31/2019: ALT 20; BUN 12; Creatinine, Ser 0.80; Potassium 4.4; Sodium 132    Lipid Panel    Component Value Date/Time   CHOL 161 01/31/2019 0829   TRIG 77 01/31/2019 0829   HDL 77 01/31/2019 0829   CHOLHDL 2.1 01/31/2019 0829   CHOLHDL 2.1 01/26/2016 1111   VLDL 17 01/26/2016 1111   LDLCALC 69 01/31/2019 0829   07/19/2019: Sodium 131, potassium 4.3, BUN 15, creatinine 0.82 AST 23, ALT 20 WBC 7.2, hemoglobin 13.9, hematocrit 40.1, platelet Total cholesterol 173, triglycerides 121, HDL 79, LDL 60    Wt Readings from Last 3 Encounters:  08/21/19 169 lb (76.7 kg)  07/31/18 157 lb (71.2 kg)  06/19/18 159 lb 6.4 oz (72.3 kg)      ASSESSMENT AND PLAN:  # Coronary calcification: # Hyperlipidemia: # Shortness of breath: Ms. Hellmann was  noted to have calcification of the aorta and LCX.  She has no chest pain.  ETT was negative for ischemia 06/2018.  # Carotid Stenosis:  1-39% stenosis bilaterally.  Continue ASA and statin.   # Occular migraine:  Ms. Kovalchuk had what is thought to be an occular migraine.  She is going to see neurology to rule out TIA.   Continue current management for now.    # Hypertension: # Hyponatremia: Sodium has been in the low 130s.  We will stop hydrochlorothiazide.  Start amlodipine 2.5 mg daily.  Continue nebivolol.    Current medicines are reviewed at length with the patient today.  The patient does not have concerns regarding medicines.  The following changes have been made: Increase rosuvastatin  Labs/ tests ordered today include:   Orders Placed This Encounter  Procedures  . Basic metabolic panel  . EKG 12-Lead    Disposition:   FU with Ginamarie Banfield C. Oval Linsey, MD, [facility] in 1 year.  PharmD in 1 month.    Signed, Chaselynn Kepple C. Oval Linsey, MD, [facility]  08/21/2019 5:09 PM    [place]

## 2019-08-21 NOTE — Progress Notes (Signed)
EKG

## 2019-08-21 NOTE — Patient Instructions (Signed)
Medication Instructions:  STOP HYDROCHLOROTHIAZIDE   START AMLODIPINE 2.5 MG DAILY   *If you need a refill on your cardiac medications before your next appointment, please call your pharmacy*  Lab Work: BMET [place] 1 MONTH   If you have labs (blood work) drawn today and your tests are completely normal, you will receive your results only by: Marland Kitchen MyChart Message (if you have MyChart) OR . A paper copy in the mail If you have any lab test that is abnormal or we need to change your treatment, we will call you to review the results.  Testing/Procedures: NONE   Follow-Up: At [facility], you and your health needs are our priority.  As part of our continuing mission to provide you with exceptional heart care, we have created designated Provider Care Teams.  These Care Teams include your primary Cardiologist (physician) and Advanced Practice Providers (APPs -  Physician Assistants and Nurse Practitioners) who all work together to provide you with the care you need, when you need it.  Your next appointment:   12 month(s)  The format for your next appointment:   In Person  Provider:   You may see DR [facility]  or one of the following Advanced Practice Providers on your designated Care Team:    Kerin Ransom, PA-C  [place], [place]  Coletta Memos, [place]  Your physician recommends that you schedule a follow-up appointment in: PHARM D 1 MONTH FOR BLOOD PRESSURE   Other Instructions MONITOR AND LOG YOUR BLOOD PRESSURE TWICE A DAY BRING YOUR READINGS AND YOUR BLOOD PRESSURE MACHINE TO FOLLOW UP

## 2019-08-22 ENCOUNTER — Encounter: Payer: Self-pay | Admitting: Podiatry

## 2019-08-22 ENCOUNTER — Ambulatory Visit: Payer: Medicare PPO | Admitting: Podiatry

## 2019-08-22 DIAGNOSIS — M722 Plantar fascial fibromatosis: Secondary | ICD-10-CM

## 2019-08-23 NOTE — Progress Notes (Signed)
Subjective:   Patient ID: Candice Saunders, female   DOB: 69 y.o.   MRN: [id number]   HPI Patient presents stating she is had reoccurrence of heel pain that is more in the center and outside of the heel and she had a couple months of relief   ROS      Objective:  Physical Exam  Neurovascular status intact with discomfort in the outside of the right heel plantar and into the central portion of the heel     Assessment:  Acute plantar fasciitis right lateral center     Plan:  H&P condition reviewed discussed possible shockwave in future and today I did sterile prep and injected the fascia 3 mg Kenalog 5 mg Xylocaine and will see back 4 weeks and decide what we might have to do with this

## 2019-08-26 ENCOUNTER — Ambulatory Visit: Payer: Medicare PPO

## 2019-08-28 NOTE — Progress Notes (Deleted)
Candice Saunders is a 69 year old ***-handed white female *** who presents for ocular migraine with cerebrovascular symptoms.  History supplemented by referring provider's note.  Candice Saunders reports history of ocular migraines presenting as ***.  She hadn't had a recurrence until ***.    Carotid ultrasound on 07/25/2019 showed 1-39% stenosis in bilateral ICAs, antegrade flow in bilateral vertebral arteries and normal flow in bilateral subclavian arteries.  Echocardiogram performed on 07/29/2019 showed EF 60-65% with no evidence of shunt/PFO/vetricular or atrial septal defect.

## 2019-08-29 ENCOUNTER — Ambulatory Visit: Payer: Medicare PPO | Admitting: Neurology

## 2019-09-10 ENCOUNTER — Other Ambulatory Visit: Payer: Self-pay | Admitting: Cardiovascular Disease

## 2019-09-11 ENCOUNTER — Ambulatory Visit: Payer: Medicare PPO | Attending: Internal Medicine

## 2019-09-11 DIAGNOSIS — Z23 Encounter for immunization: Secondary | ICD-10-CM

## 2019-09-11 NOTE — Progress Notes (Signed)
   Covid-19 Vaccination Clinic  Name:  CYERRA LEHNE    MRN: [id number] DOB: 02-Aug-1950  09/11/2019  Ms. Eckenrode was observed post Covid-19 immunization for 15 minutes without incident. She was provided with Vaccine Information Sheet and instruction to access the V-Safe system.   Ms. Urias was instructed to call 911 with any severe reactions post vaccine: Marland Kitchen Difficulty breathing  . Swelling of face and throat  . A fast heartbeat  . A bad rash all over body  . Dizziness and weakness   Immunizations Administered    Name Date Dose VIS Date Route   Pfizer COVID-19 Vaccine 09/11/2019  9:11 AM 0.3 mL 06/21/2019 Intramuscular   Manufacturer: [place]   Lot: [id number]   [place]: [id number]

## 2019-09-19 ENCOUNTER — Ambulatory Visit: Payer: Medicare PPO

## 2019-10-08 ENCOUNTER — Other Ambulatory Visit: Payer: Self-pay

## 2019-10-08 ENCOUNTER — Ambulatory Visit (INDEPENDENT_AMBULATORY_CARE_PROVIDER_SITE_OTHER): Payer: Medicare PPO | Admitting: Pharmacist Clinician (PhC)/ Clinical Pharmacy Specialist

## 2019-10-08 VITALS — BP 126/80 | HR 56 | Wt 172.0 lb

## 2019-10-08 DIAGNOSIS — I10 Essential (primary) hypertension: Secondary | ICD-10-CM | POA: Diagnosis not present

## 2019-10-08 NOTE — Patient Instructions (Signed)
If you have any issues or concerns about your blood pressure, please call Erasmo Downer or Raquel at [phone]  Go to the lab today to repeat kidney function and electrolytes  Check your blood pressure at home several times each week and keep record of the readings.  Take your BP meds as follows:  Continue with your current medications  Bring all of your meds, your BP cuff and your record of home blood pressures to your next appointment.  Exercise as you're able, try to walk approximately 30 minutes per day.  Keep salt intake to a minimum, especially watch canned and prepared boxed foods.  Eat more fresh fruits and vegetables and fewer canned items.  Avoid eating in fast food restaurants.    HOW TO TAKE YOUR BLOOD PRESSURE: . Rest 5 minutes before taking your blood pressure. .  Don't smoke or drink caffeinated beverages for at least 30 minutes before. . Take your blood pressure before (not after) you eat. . Sit comfortably with your back supported and both feet on the floor (don't cross your legs). . Elevate your arm to heart level on a table or a desk. . Use the proper sized cuff. It should fit smoothly and snugly around your bare upper arm. There should be enough room to slip a fingertip under the cuff. The bottom edge of the cuff should be 1 inch above the crease of the elbow. . Ideally, take 3 measurements at one sitting and record the average.

## 2019-10-08 NOTE — Progress Notes (Signed)
10/08/2019 SHAYNE LANDSVERK [birth year] [id number]   HPI:  ANAISE Saunders is a 69 y.o. female patient of Dr Oval Linsey, with a [place] below who presents today for hypertension clinic evaluation.  She was seen by Dr. Oval Linsey last month and her BP was good at 116/70.  However her most recent labs (from the previous month) showed hyponatremia with a level of 131.  Patent states this has been a problem for several years.  Dr. Oval Linsey chose to discontinue HCTZ and started the patient on amlodipine 2.5 mg daily.    She is in the office today for follow up.  She has not had any issues with the change from HCTZ to amlodipine.  Her home BP readings remain constant in the 123456 systolic.  No evidence of lower extremity edema or other concerns.    Past Medical History: hyperlipidemia 1/21 TC 163, TG 121, HDL 79, LDL 60 - on rosuvastatin 20  Asymptomatic coronary disease Mild carotid stenosis (1-39%), asymptomatic coronary calcification noted on CT of her chest.   ocluar migraine Seeing Neurology next week.      Blood Pressure Goal:  130/80  Current Medications:  Bystolic 2.5 mg qd, amlodipine 2.5  Family Hx: hyperlipidemia and hypertension in father and sister; CKD in father, died from CHF (died at 7), mother with stroke at 57, alzheimers', breast cancer (died at 46)  Social Hx:  Former smoker, quit 15 years ago, occasional beer on weekends; 3 coffee per day, no tea or soda  Diet:  Home cooked meals, mix of proteins, salad and fresh veggies;  Likes potato chips, but avoids as much as possible.   Exercise:  Walks most days, previously went to senior [employer]  ([employer] - free exercise program for seniors)    Home BP readings: home Omron cuff, about 71 years old.  Read within 10 points of office cuff.    Intolerances: nkda  Labs: 1/21:  Na 131, K 4.3, Glu 110, BUN 16, SCr 0.82  Wt Readings from Last 3 Encounters:  10/08/19 172 lb (78 kg)  08/21/19 169 lb (76.7 kg)   07/31/18 157 lb (71.2 kg)   BP Readings from Last 3 Encounters:  10/08/19 126/80  08/21/19 116/70  02/18/19 116/65   Pulse Readings from Last 3 Encounters:  10/08/19 (!) 56  08/21/19 63  02/18/19 65    Current Outpatient Medications  Medication Sig Dispense Refill  . amLODipine (NORVASC) 2.5 MG tablet Take 1 tablet (2.5 mg total) by mouth daily. 90 tablet 3  . aspirin 81 MG tablet Take 81 mg by mouth daily.    Marland Kitchen BYSTOLIC 2.5 MG tablet TAKE 1 TABLET(2.5 MG) BY MOUTH DAILY 30 tablet 10  . Cholecalciferol (VITAMIN D3) 3000 units TABS Take by mouth.    . Multiple Vitamins-Minerals (PRESERVISION AREDS 2 PO) Take by mouth.    . rosuvastatin (CRESTOR) 20 MG tablet TAKE 1 TABLET BY MOUTH EVERY DAY 90 tablet 1   No current facility-administered medications for this visit.    No Known Allergies  Past Medical History:  Diagnosis Date  . Allergy   . Carotid stenosis 08/21/2019  . Coronary artery calcification seen on CAT scan 06/19/2018  . Depression   . Diverticulosis   . Essential hypertension 06/19/2018  . Exertional dyspnea 06/19/2018  . Extra mammary Paget disease   . 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 Hx of colonic polyps  09/29/2011  . Hyperlipidemia   . Hyponatremia 08/21/2019  . Macular degeneration 10/09/2012   John Matthews/Retinal specialist  . Ocular migraine 08/21/2019  . Osteopenia 09/29/2011  . Osteoporosis     Blood pressure 126/80, pulse (!) 56, weight 172 lb (78 kg).  Essential hypertension Patient with essential hypertension, currently well controlled on Bystolic 2.5 mg and amlodipine 2.5 mg, both once daily.  She will repeat BMET today, after having discontinued the HCTZ last month, due to chronic hyponatremia.  Had a long discussion on the mechanisms of action of her medications and how low doses of two different drugs can often work better than a higher dose of one.  Answered all  questions.  Patient aware to call us should she see her BP at home trend upward, otherwise continue with regular cardiology appointments.    Tommy Medal PharmD CPP [facility] [address] [place], [place] [address] [phone]

## 2019-10-08 NOTE — Assessment & Plan Note (Signed)
Patient with essential hypertension, currently well controlled on Bystolic 2.5 mg and amlodipine 2.5 mg, both once daily.  She will repeat BMET today, after having discontinued the HCTZ last month, due to chronic hyponatremia.  Had a long discussion on the mechanisms of action of her medications and how low doses of two different drugs can often work better than a higher dose of one.  Answered all questions.  Patient aware to call us should she see her BP at home trend upward, otherwise continue with regular cardiology appointments.

## 2019-10-09 LAB — BASIC METABOLIC PANEL
BUN/Creatinine Ratio: 18 (ref 12–28)
BUN: 17 mg/dL (ref 8–27)
CO2: 25 mmol/L (ref 20–29)
Calcium: 9.4 mg/dL (ref 8.7–10.3)
Chloride: 96 mmol/L (ref 96–106)
Creatinine, Ser: 0.95 mg/dL (ref 0.57–1.00)
GFR calc Af Amer: 70 mL/min/{1.73_m2} (ref >59)
GFR calc non Af Amer: 61 mL/min/{1.73_m2} (ref >59)
Glucose: 94 mg/dL (ref 65–99)
Potassium: 5 mmol/L (ref 3.5–5.2)
Sodium: 134 mmol/L (ref 134–144)

## 2019-10-16 NOTE — Progress Notes (Signed)
NEUROLOGY CONSULTATION NOTE  KESIA FINNEGAN MRN: [id number] DOB: 18-Aug-1950  Referring provider: Horald Pollen, MD Primary care provider: Horald Pollen, MD  Reason for consult:  Ocular migraines; vascular symptoms  HISTORY OF PRESENT ILLNESS: Viriginia Chambless. Schorsch is a 69 year old right-handed white female with carotid stenosis, CAD, HLD, macular degeneration, HTN who presents for ocular migraines and vascular symptoms.  History supplemented by PCP and cardiology notes.  On 07/08/2019, she was sitting in her living room when she developed a jagged spot in her visual field, possibly right eye but she didn't close either eye to check.  Her friend called her and she was not making any sense when she spoke and she had difficulty understanding her friend.  She also noted difficulty reading and writing.  No headache, slurred speech, photophobia, phonophobia, numbness or weakness.  This lasted 30 to 40 minutes and gradually resolved completely over 2 hours.  She has had no recurrence.  She has had these visual auras before, maybe three in her life, but never before had the aphasia.    Never associated with headache.  She denies history of headache.    She has had no change in her medications:  ASA 81mg  daily; Crestor 20mg  daily; amlodipine 2.5mg  daily; Bystolic 2.5mg  daily.  She underwent stroke workup: 07/19/2019 LABS:  HGB A1c 5.8; Lipid panel with chol 163, TG 121, HDL 79, LDL 60. 07/25/2019 MRI HEAD WO (personally reviewed):  No acute intracranial pathology identified.  Mild chronic small vessel ischemic change. 07/25/2019 CAROTID DUPLEX:  1-39% right ICA stenosis; 1-39% stenosis in left ICA; non-hemodynamically significant plaque <50% noted in the CCA; bilateral vertebral arteries demonstrate antegrade flow; normal flow hemodynamics were seen in bilateral subclavian arteries. 07/29/2019 2D ECHOCARDIOGRAM:  LVEF 60-65%; trivial MR, mild to moderate aortic valve sclerosis/calcification without  stenosis, mildly increased LVH; no atrial level shunt, PFO or atrial septal defect.  PAST MEDICAL HISTORY: Past Medical History:  Diagnosis Date  . Allergy   . Carotid stenosis 08/21/2019  . Coronary artery calcification seen on CAT scan 06/19/2018  . Depression   . Diverticulosis   . Essential hypertension 06/19/2018  . Exertional dyspnea 06/19/2018  . Extra mammary Paget disease   . 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 Hx of colonic polyps 09/29/2011  . Hyperlipidemia   . Hyponatremia 08/21/2019  . Macular degeneration 10/09/2012   John Matthews/Retinal specialist  . Ocular migraine 08/21/2019  . Osteopenia 09/29/2011  . Osteoporosis     PAST SURGICAL HISTORY: Past Surgical History:  Procedure Laterality Date  . Admission  07/11/2002   GI bleeding due to polyp.    . COLONOSCOPY W/ POLYPECTOMY  11/09/2010   Buccini; every 2-3 years  . extramammary paget's resection perianal region 07/06/2014 [facility] Colorectal surgery/[facility]  07/06/2014  . EYE SURGERY     Cataract resection  . ROTATOR CUFF REPAIR  01/08/2005   Right    MEDICATIONS: Current Outpatient Medications on File Prior to Visit  Medication Sig Dispense Refill  . amLODipine (NORVASC) 2.5 MG tablet Take 1 tablet (2.5 mg total) by mouth daily. 90 tablet 3  . aspirin 81 MG tablet Take 81 mg by mouth daily.    Marland Kitchen BYSTOLIC 2.5 MG tablet TAKE 1 TABLET(2.5 MG) BY MOUTH DAILY 30 tablet 10  . Cholecalciferol (VITAMIN D3) 3000 units TABS Take by mouth.    . Multiple Vitamins-Minerals (PRESERVISION AREDS 2 PO) Take by mouth.    Marland Kitchen  rosuvastatin (CRESTOR) 20 MG tablet TAKE 1 TABLET BY MOUTH EVERY DAY 90 tablet 1   No current facility-administered medications on file prior to visit.    ALLERGIES: No Known Allergies  FAMILY HISTORY: Family History  Problem Relation Age of Onset  . Alzheimer's disease Mother   . Cancer Mother 72       Breast cancer  .  Osteoporosis Mother   . Stroke Mother   . Breast cancer Mother   . Heart disease Father 87       AMI, CHF  . Kidney disease Father 66       ESRD/HD  . Hyperlipidemia Father   . Hypertension Father   . Heart failure Father   . Heart attack Father   . Stroke Brother 73       TIA  . Hyperlipidemia Sister   . Hypertension Sister   . Cancer Sister 67       oral cancer  . Cancer Maternal Grandmother   . SOCIAL HISTORY: Social History   Socioeconomic History  . Marital status: Single    Spouse name: Not on file  . Number of children: 0  . Years of education: Not on file  . Highest education level: Not on file  Occupational History  . Occupation: retired    Comment: Teaching laboratory technician; retired in 2004.  Tobacco Use  . Smoking status: Former Smoker    Years: 30.00    Types: Cigarettes    Quit date: 08/26/2006    Years since quitting: 13.1  . Smokeless tobacco: Never Used  Substance and Sexual Activity  . Alcohol use: Yes    Alcohol/week: 18.0 standard drinks    Types: 18 Cans of beer per week    Comment: drinks 2 beers daily; 4 beers on Friday and Saturdays.  . Drug use: No  . Sexual activity: Not Currently    Partners: Female    Comment: Same sex sexual partners  Other Topics Concern  . Not on file  Social History Narrative   Marital status: single.  Dating x casually/seriously.   Seeing once weekly.      Children: none     Lives: with ex-partner since 2012; moving out in 10/2012.Lives alone in [place] in home.          Employment: retired since 2004; school psychologist x 30 years.        Tobacco: quit smoking 2004.       Alcohol:  beer on weekends.      Drugs: none      Exercise: no  Walking or hiking in 2018. Goal is 10,000 steps per day in 2018.      Seatbelt: 100%; no texting while driving. Flip phone in 2017.      Sunscreen:  Sporadic.      Guns: none.      Sexual activity: same sex partners.      ADLs: independent life; drives.      Advanced Directive:  Yes;  Animal nutritionist.   FULL CODE.                    Social Determinants of Health   Financial Resource Strain:   . Difficulty of Paying Living Expenses:   Food Insecurity:   . Worried About Charity fundraiser in the Last Year:   . Arboriculturist in the Last Year:   Transportation Needs:   . Film/video editor (Medical):   Marland Kitchen Lack of  Transportation (Non-Medical):   Physical Activity:   . Days of Exercise per Week:   . Minutes of Exercise per Session:   Stress:   . Feeling of Stress :   Social Connections:   . Frequency of Communication with Friends and Family:   . Frequency of Social Gatherings with Friends and Family:   . Attends Religious Services:   . Active Member of Clubs or Organizations:   . Attends Archivist Meetings:   Marland Kitchen Marital Status:   Intimate Partner Violence:   . Fear of Current or Ex-Partner:   . Emotionally Abused:   Marland Kitchen Physically Abused:   . Sexually Abused:     REVIEW OF SYSTEMS: Constitutional: No fevers, chills, or sweats, no generalized fatigue, change in appetite Eyes: No visual changes, double vision, eye pain Ear, nose and throat: No hearing loss, ear pain, nasal congestion, sore throat Cardiovascular: No chest pain, palpitations Respiratory:  No shortness of breath at rest or with exertion, wheezes GastrointestinaI: No nausea, vomiting, diarrhea, abdominal pain, fecal incontinence Genitourinary:  No dysuria, urinary retention or frequency Musculoskeletal:  No neck pain, back pain Integumentary: No rash, pruritus, skin lesions Neurological: as above Psychiatric: No depression, insomnia, anxiety Endocrine: No palpitations, fatigue, diaphoresis, mood swings, change in appetite, change in weight, increased thirst Hematologic/Lymphatic:  No purpura, petechiae. Allergic/Immunologic: no itchy/runny eyes, nasal congestion, recent allergic reactions, rashes  PHYSICAL EXAM: Blood pressure (!) 144/76, pulse 68, height 5\' 3"   (1.6 m), weight 172 lb (78 kg), SpO2 98 %. General: No acute distress.  Patient appears well-groomed.  Head:  Normocephalic/atraumatic Eyes:  fundi examined but not visualized Neck: supple, no paraspinal tenderness, full range of motion Back: No paraspinal tenderness Heart: regular rate and rhythm Lungs: Clear to auscultation bilaterally. Vascular: No carotid bruits. Neurological Exam: Mental status: alert and oriented to person, place, and time, recent and remote memory intact, fund of knowledge intact, attention and concentration intact, speech fluent and not dysarthric, language intact. Cranial nerves: CN I: not tested CN II: pupils equal, round and reactive to light, visual fields intact CN III, IV, VI:  full range of motion, no nystagmus, no ptosis CN V: facial sensation intact CN VII: upper and lower face symmetric CN VIII: hearing intact CN IX, X: gag intact, uvula midline CN XI: sternocleidomastoid and trapezius muscles intact CN XII: tongue midline Bulk & Tone: normal, no fasciculations. Motor:  5/5 throughout  Sensation:  Pinprick and vibration sensation intact. Deep Tendon Reflexes:  2+ throughout, toes downgoing.  Finger to nose testing:  Without dysmetria.  Heel to shin:  Without dysmetria.  Gait:  Normal station and stride.  Able to turn, mildly unsteady tandem walk. Romberg negative.  IMPRESSION: 1.  Suspect migraine with aura, without status migrainosus, not intractable, presenting with visual aura and aphasia, rather than TIA.  Her visual disturbance seems consistent with a scotoma rather than visual field cut.  She also has had habitual episodes in the past.  However, the aphasia is new, and given her age and comorbidities, I would err on side of caution and not completely dismiss TIA. 2.  Hypertension 3.  Hyperlipidemia  PLAN: 1.  No change in medication at this time.  Continue ASA 81mg  daily.  Hgb A1c at goal of less than 7; LDL at goal of less than 70. 2.  I  do want to check a CTA of the head to particularly evaluate for left PCA stenosis which may contribute to TIA presenting with right sided visual  field cut and aphasia (thalamic).  If this is present, I would likely have her switch ASA to Plavix 75mg  daily and have her follow up in about 4 months. 3.  If CTA unremarkable, no change in management and follow up as needed.  Thank you for allowing me to take part in the care of this patient.  Metta Clines, DO  CC: Horald Pollen, MD

## 2019-10-17 ENCOUNTER — Other Ambulatory Visit: Payer: Self-pay

## 2019-10-17 ENCOUNTER — Ambulatory Visit: Payer: Medicare PPO | Admitting: Neurology

## 2019-10-17 ENCOUNTER — Encounter: Payer: Self-pay | Admitting: Neurology

## 2019-10-17 VITALS — BP 144/76 | HR 68 | Ht 63.0 in | Wt 172.0 lb

## 2019-10-17 DIAGNOSIS — G43109 Migraine with aura, not intractable, without status migrainosus: Secondary | ICD-10-CM | POA: Diagnosis not present

## 2019-10-17 DIAGNOSIS — I10 Essential (primary) hypertension: Secondary | ICD-10-CM

## 2019-10-17 DIAGNOSIS — E785 Hyperlipidemia, unspecified: Secondary | ICD-10-CM

## 2019-10-17 DIAGNOSIS — R4701 Aphasia: Secondary | ICD-10-CM

## 2019-10-17 DIAGNOSIS — H539 Unspecified visual disturbance: Secondary | ICD-10-CM | POA: Diagnosis not present

## 2019-10-17 NOTE — Patient Instructions (Addendum)
I do believe that the event was a migraine and not a TIA.  However, I would still like to check the blood vessels in your brain to look for any narrowing of blood vessels that may correlate with your symptoms from a stroke standpoint.  Will let you know results and further recommendations if needed.  Otherwise, no change in medications is recommended.

## 2019-11-04 ENCOUNTER — Ambulatory Visit
Admission: RE | Admit: 2019-11-04 | Discharge: 2019-11-04 | Disposition: A | Discharge status: Home / Self Care | Payer: Medicare PPO | Source: Ambulatory Visit | Attending: Neurology | Admitting: Neurology

## 2019-11-04 DIAGNOSIS — R4701 Aphasia: Secondary | ICD-10-CM

## 2019-11-04 MED ORDER — IOPAMIDOL (ISOVUE-370) INJECTION 76%
80.0000 mL | Freq: Once | INTRAVENOUS | Status: AC | PRN
  Administered 2019-11-04: 80 mL via INTRAVENOUS

## 2019-12-27 ENCOUNTER — Other Ambulatory Visit: Payer: Self-pay

## 2019-12-27 ENCOUNTER — Ambulatory Visit: Payer: Medicare PPO | Admitting: Podiatry

## 2019-12-27 ENCOUNTER — Encounter: Payer: Self-pay | Admitting: Podiatry

## 2019-12-27 VITALS — Temp 97.5°F

## 2019-12-27 DIAGNOSIS — M722 Plantar fascial fibromatosis: Secondary | ICD-10-CM

## 2019-12-29 NOTE — Progress Notes (Signed)
Subjective:   Patient ID: Candice Saunders, female   DOB: 69 y.o.   MRN: [id number]   HPI Patient presents stating I developed more pain now in the outside bottom of my right heel and also into the back of my right ankle.  I am wondering about shockwave or anything else I can do as I do not want to have surgery   ROS      Objective:  Physical Exam  Neuro vascular status intact with posterior plantar lateral pain of the heel at its insertion calcaneus with discomfort also around the peroneal tendon that is moderate in its intensity     Assessment:  What appears to be compensatory chronic plantar fasciitis right along with peroneal tendinitis right which is probably compensatory     Plan:  H&P discussed chronic nature of condition and the probability for shockwave of the plantar heel or peroneal complex.  At this point organ to try conservative 1 more time before considering this and I did a plantar lateral injection keeping off the medial central portion tendon 3 mg Dexasone Kenalog 5 mg Xylocaine and applied an air fracture walker to try to reduce all weightbearing forces on the foot and peroneal tendon and educated on its usage.  Reappoint as symptoms indicate

## 2020-02-02 ENCOUNTER — Other Ambulatory Visit: Payer: Self-pay | Admitting: Cardiovascular Disease

## 2020-02-25 ENCOUNTER — Telehealth: Payer: Self-pay | Admitting: Podiatry

## 2020-02-25 NOTE — Telephone Encounter (Signed)
Called concerning epat instructions. Will call patient to discuss

## 2020-03-03 ENCOUNTER — Encounter (INDEPENDENT_AMBULATORY_CARE_PROVIDER_SITE_OTHER): Payer: Medicare Other | Admitting: Ophthalmology

## 2020-03-03 ENCOUNTER — Other Ambulatory Visit: Payer: Self-pay

## 2020-03-03 DIAGNOSIS — I10 Essential (primary) hypertension: Secondary | ICD-10-CM | POA: Diagnosis not present

## 2020-03-03 DIAGNOSIS — H353111 Nonexudative age-related macular degeneration, right eye, early dry stage: Secondary | ICD-10-CM | POA: Diagnosis not present

## 2020-03-03 DIAGNOSIS — H35033 Hypertensive retinopathy, bilateral: Secondary | ICD-10-CM

## 2020-03-03 DIAGNOSIS — H353122 Nonexudative age-related macular degeneration, left eye, intermediate dry stage: Secondary | ICD-10-CM

## 2020-03-03 DIAGNOSIS — H43813 Vitreous degeneration, bilateral: Secondary | ICD-10-CM

## 2020-04-03 ENCOUNTER — Ambulatory Visit (INDEPENDENT_AMBULATORY_CARE_PROVIDER_SITE_OTHER): Payer: Medicare PPO | Admitting: *Deleted

## 2020-04-03 ENCOUNTER — Other Ambulatory Visit: Payer: Self-pay

## 2020-04-03 DIAGNOSIS — M722 Plantar fascial fibromatosis: Secondary | ICD-10-CM

## 2020-04-03 DIAGNOSIS — M79676 Pain in unspecified toe(s): Secondary | ICD-10-CM

## 2020-04-03 NOTE — Progress Notes (Signed)
Patient presents for the 1st EPAT treatment today with complaint of plantar heel pain right. Diagnosed with plantar fasciitis by Dr. Paulla Dolly. This has been ongoing for several months. The patient has tried ice, stretching, NSAIDS and supportive shoe gear with no long term relief.   Most of the pain is located plantar and lateral heel.  ESWT administered and tolerated well.Treatment settings initiated at:   Energy: 20  Decreased energy due to sensitivity  Ended treatment session today with 3000 shocks at the following settings:   Energy: 15  Frequency: 5.0  Joules: 14.93  Arch massager was utilized x 3 rounds  Advised to avoid ice and NSAIDs throughout the treatment process and to utilize boot or supportive shoes for at least the next 3 days.  She does have a CAM boot.  Follow up for 2nd treatment in 1 week.

## 2020-04-03 NOTE — Patient Instructions (Signed)

## 2020-04-10 ENCOUNTER — Ambulatory Visit (INDEPENDENT_AMBULATORY_CARE_PROVIDER_SITE_OTHER): Payer: Medicare PPO | Admitting: *Deleted

## 2020-04-10 ENCOUNTER — Other Ambulatory Visit: Payer: Self-pay

## 2020-04-10 DIAGNOSIS — M722 Plantar fascial fibromatosis: Secondary | ICD-10-CM

## 2020-04-10 DIAGNOSIS — B351 Tinea unguium: Secondary | ICD-10-CM

## 2020-04-10 NOTE — Progress Notes (Signed)
Patient presents for the 2nd EPAT treatment today with complaint of plantar heel pain right. Diagnosed with plantar fasciitis by Dr. Paulla Dolly. This has been ongoing for several months. The patient has tried ice, stretching, NSAIDS and supportive shoe gear with no long term relief.   Most of the pain is located plantar and lateral heel. She says that she is not feeling the shooting pain as she was before.  ESWT administered and tolerated well.Treatment settings initiated at:   Energy: 20   Ended treatment session today with 3000 shocks at the following settings:   Energy: 20  Frequency: 5.0  Joules: 19.62  Arch massager was utilized x 3 rounds  Advised to avoid ice and NSAIDs throughout the treatment process and to utilize boot or supportive shoes for at least the next 3 days.  She does have a CAM boot.  She is going to [place] this weekend and will be increasing her activity.  Follow up for 3rd treatment in 1 week.

## 2020-04-16 ENCOUNTER — Ambulatory Visit (INDEPENDENT_AMBULATORY_CARE_PROVIDER_SITE_OTHER): Payer: Medicare PPO | Admitting: *Deleted

## 2020-04-16 ENCOUNTER — Other Ambulatory Visit: Payer: Self-pay

## 2020-04-16 DIAGNOSIS — B351 Tinea unguium: Secondary | ICD-10-CM

## 2020-04-16 DIAGNOSIS — M722 Plantar fascial fibromatosis: Secondary | ICD-10-CM

## 2020-04-16 NOTE — Progress Notes (Signed)
Patient presents for the 3rd EPAT treatment today with complaint of plantar heel pain right. Diagnosed with plantar fasciitis by Dr. Paulla Dolly. This has been ongoing for several months. The patient has tried ice, stretching, NSAIDS and supportive shoe gear with no long term relief.   Most of the pain is now just located plantar heel. She says she hasn't had anymore sharp, shooting pain and the lateral side is feeling better as well. She feels better overall, but its gradual and slow.  ESWT administered and tolerated well.Treatment settings initiated at:   Energy: 25   Ended treatment session today with 3000 shocks at the following settings:   Energy: 25  Frequency: 4.0  Joules: 24.72  Arch massager was utilized x 3 rounds  Advised to avoid ice and NSAIDs throughout the treatment process and to utilize boot or supportive shoes for at least the next 3 days.  She does have a CAM boot.  Follow up for 4th treatment in 2 weeks, which she has scheduled already. She will see Dr. Paulla Dolly 2 weeks following the last EPAT treatment and she will go ahead and set this appointment up.

## 2020-04-17 ENCOUNTER — Other Ambulatory Visit: Payer: Medicare PPO

## 2020-04-30 ENCOUNTER — Ambulatory Visit (INDEPENDENT_AMBULATORY_CARE_PROVIDER_SITE_OTHER): Payer: Medicare PPO | Admitting: *Deleted

## 2020-04-30 ENCOUNTER — Other Ambulatory Visit: Payer: Self-pay

## 2020-04-30 DIAGNOSIS — M722 Plantar fascial fibromatosis: Secondary | ICD-10-CM

## 2020-04-30 NOTE — Progress Notes (Signed)
Patient presents for the 4th EPAT treatment today with complaint of plantar heel pain right. Diagnosed with plantar fasciitis by Dr. Paulla Dolly. This has been ongoing for several months. The patient has tried ice, stretching, NSAIDS and supportive shoe gear with no long term relief.   Most of the pain is now located medial and lateral heel. The plantar pain has improved. She doesn't notice too much change from the last visit. Still comes and goes in intensity.  ESWT administered and tolerated well.Treatment settings initiated at:   Energy: 30   Ended treatment session today with 3000 shocks at the following settings:   Energy: 30  Frequency: 4.0  Joules: 29.43  Arch massager was utilized x 3 rounds  Advised to avoid ice and NSAIDs throughout the treatment process and to utilize boot or supportive shoes for at least the next 3 days.  She does have a CAM boot. She hasn't used the boot because she says the pain hasn't been that bad and also the boot makes other parts of her body hurt.  Follow up with Dr. Paulla Dolly in 2 weeks, which she has already made, for re-evaluation.

## 2020-05-01 ENCOUNTER — Other Ambulatory Visit: Payer: Self-pay

## 2020-05-14 ENCOUNTER — Encounter: Payer: Self-pay | Admitting: Podiatry

## 2020-05-14 ENCOUNTER — Ambulatory Visit: Payer: Medicare PPO | Admitting: Podiatry

## 2020-05-14 ENCOUNTER — Other Ambulatory Visit: Payer: Self-pay

## 2020-05-14 DIAGNOSIS — M722 Plantar fascial fibromatosis: Secondary | ICD-10-CM

## 2020-05-14 NOTE — Progress Notes (Signed)
Subjective:   Patient ID: Candice Saunders, female   DOB: 69 y.o.   MRN: [id number]   HPI Patient presents stating I am feeling better but I feel like I cannot stretch my foot the way I would like to.  States that I do think I have made good progress neuro   ROS      Objective:  Physical Exam  Vascular status intact with patient's plantar heel improved but painful only upon deep palpation currently.  She does have discomfort in the peroneal but again mild and is in nature     Assessment:  Improvement in plantar fasciitis but pain still noted upon deep palpation but overall I am happy with the progress we are making     Plan:  Reviewed condition and at this point I did dispense a night splint as I want her to continue to try to get as much stretch as possible along with ice which was dispensed today with Ace wrap.  Patient is discharged and will be seen back as needed

## 2020-05-18 ENCOUNTER — Other Ambulatory Visit: Payer: Self-pay | Admitting: Family Medicine

## 2020-05-18 DIAGNOSIS — Z87891 Personal history of nicotine dependence: Secondary | ICD-10-CM

## 2020-07-02 ENCOUNTER — Ambulatory Visit
Admission: RE | Admit: 2020-07-02 | Discharge: 2020-07-02 | Disposition: A | Discharge status: Home / Self Care | Payer: Medicare PPO | Source: Ambulatory Visit | Attending: Family Medicine | Admitting: Family Medicine

## 2020-07-02 ENCOUNTER — Other Ambulatory Visit: Payer: Self-pay | Admitting: Family Medicine

## 2020-07-02 DIAGNOSIS — Z87891 Personal history of nicotine dependence: Secondary | ICD-10-CM

## 2020-08-08 ENCOUNTER — Other Ambulatory Visit: Payer: Self-pay | Admitting: Cardiovascular Disease

## 2020-08-12 ENCOUNTER — Other Ambulatory Visit: Payer: Self-pay | Admitting: Cardiovascular Disease

## 2020-08-28 ENCOUNTER — Ambulatory Visit: Payer: Medicare PPO | Admitting: Cardiovascular Disease

## 2020-08-28 ENCOUNTER — Encounter: Payer: Self-pay | Admitting: Cardiovascular Disease

## 2020-08-28 ENCOUNTER — Other Ambulatory Visit: Payer: Self-pay

## 2020-08-28 VITALS — BP 132/80 | HR 61 | Ht 63.0 in | Wt 176.4 lb

## 2020-08-28 DIAGNOSIS — E785 Hyperlipidemia, unspecified: Secondary | ICD-10-CM | POA: Diagnosis not present

## 2020-08-28 DIAGNOSIS — I6523 Occlusion and stenosis of bilateral carotid arteries: Secondary | ICD-10-CM

## 2020-08-28 DIAGNOSIS — I10 Essential (primary) hypertension: Secondary | ICD-10-CM | POA: Diagnosis not present

## 2020-08-28 DIAGNOSIS — Z5181 Encounter for therapeutic drug level monitoring: Secondary | ICD-10-CM | POA: Diagnosis not present

## 2020-08-28 DIAGNOSIS — I2510 Atherosclerotic heart disease of native coronary artery without angina pectoris: Secondary | ICD-10-CM

## 2020-08-28 NOTE — Progress Notes (Signed)
Cardiology Office Note  Date:  08/30/2020   ID:  Candice, Saunders 02-26-1951, MRN [id number]  PCP:  Candice Rasmussen, MD  Cardiologist:   Candice Latch, MD   No chief complaint on file.   History of Present Illness: Candice Saunders is a 70 y.o. female with hypertension, hyperlipidemia, mild carotid stenois, asymptomatic coronary calcification, COPD, and prior tobacco abuse here for follow up.  She was initially seen 06/2018 for the evaluation of atherosclerosis.  Candice Saunders had a chest CT for lung cancer screening on 05/01/2018.  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  She also had pulmonary nodules, emphysema and COPD.  She was referred to cardiology for further evaluation.  Candice Saunders was feeling well but did have some shortness of breath when walking up hills.  Given these findings she was referred for an ETT that was negative for ischemia.  She achieved 7 METS on a Bruce protocol.  She was started on aspirin and rosuvastatin.  Since then she has felt generally well.  However she did have an episode of transient aphasia.  She saw Dr. Darron Doom and was referred for carotid Dopplers that revealed mild stenosis bilaterally.  Echocardiogram 07/2019 revealed LVEF 60 to 65% with mild LVH and aortic sclerosis without stenosis.  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  She is scheduled to see a neurologist soon.  She has not been able to exercise much lately due to plantar fasciitis.  She denies exertional chest pain or shortness of breath.  She has noticed that on her labs her sodium has been running in the 130s for the last year or two.  At her last appointment hydrochlorothiazide was switched to amlodipine due to hypokalemia.  She followed up with our pharmacist and was doing well.  She started going to the gym and is doing strength training.  Her planar fasciitis is also improving.  She wants to start doing more cardio.  Her BP at home has been  well-controlled.  She has no exertional chest pain.  She does get short of breat at times, but not more than expected.  This seems to be improving the more she exercises.  She denies lower extremity edema, orthopnea, or PND.     Past Medical History:  Diagnosis Date  . Allergy   . Carotid stenosis 08/21/2019  . Coronary artery calcification seen on CAT scan 06/19/2018  . Depression   . Diverticulosis   . Essential hypertension 06/19/2018  . Exertional dyspnea 06/19/2018  . Extra mammary Paget disease   . 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 Hx of colonic polyps 09/29/2011  . Hyperlipidemia   . Hyponatremia 08/21/2019  . Macular degeneration 10/09/2012   [facility]/Retinal specialist  . Ocular migraine 08/21/2019  . Osteopenia 09/29/2011  . Osteoporosis     Past Surgical History:  Procedure Laterality Date  . Admission  07/11/2002   GI bleeding due to polyp.    . COLONOSCOPY W/ POLYPECTOMY  11/09/2010   Buccini; every 2-3 years  . extramammary paget's resection perianal region 07/06/2014 [facility] Colorectal [facility]  07/06/2014  . EYE SURGERY     Cataract resection  . ROTATOR CUFF REPAIR  01/08/2005   Right     Current Outpatient Medications  Medication Sig Dispense Refill  . amLODipine (NORVASC) 2.5 MG tablet TAKE 1 TABLET(2.5 MG) BY MOUTH DAILY 90 tablet 3  .  aspirin EC 81 MG tablet Take 81 mg by mouth daily. Swallow whole.    . Cholecalciferol (VITAMIN D3) 3000 units TABS Take by mouth.    . Multiple Vitamins-Minerals (PRESERVISION AREDS 2 PO) Take by mouth.    . nebivolol (BYSTOLIC) 2.5 MG tablet TAKE 1 TABLET(2.5 MG) BY MOUTH DAILY 30 tablet 10  . rosuvastatin (CRESTOR) 20 MG tablet TAKE 1 TABLET BY MOUTH EVERY DAY 90 tablet 2   No current facility-administered medications for this visit.    Allergies:   Patient has no known allergies.    Social History:  The patient  reports that she quit  smoking about 14 years ago. Her smoking use included cigarettes. She quit after 30.00 years of use. She has never used smokeless tobacco. She reports current alcohol use of about 18.0 standard drinks of alcohol per week. She reports that she does not use drugs.   Family History:  The patient's family history includes Alzheimer's disease in her mother; Breast cancer in her mother; Cancer in her maternal grandmother; Cancer (age of onset: 58) in her sister; Cancer (age of onset: 53) in her mother; Heart attack in her father; Heart disease (age of onset: 100) in her father; Heart failure in her father; Hyperlipidemia in her father and sister; Hypertension in her father and sister; Kidney disease (age of onset: 38) in her father; Osteoporosis in her mother; Stroke in her mother; Stroke (age of onset: 26) in her brother.    ROS:  Please see the history of present illness.   Otherwise, review of systems are positive for none.   All other systems are reviewed and negative.    PHYSICAL EXAM: VS:  BP 132/80 (BP Location: Left Arm, Patient Position: Sitting)   Pulse 61   Ht 5\' 3"  (1.6 m)   Wt 176 lb 6.4 oz (80 kg)   SpO2 99%   BMI 31.25 kg/m  , BMI Body mass index is 31.25 kg/m. GENERAL:  Well appearing HEENT: Pupils equal round and reactive, fundi not visualized, oral mucosa unremarkable NECK:  No jugular venous distention, waveform within normal limits, carotid upstroke brisk and symmetric, no bruits LUNGS:  Clear to auscultation bilaterally HEART:  RRR.  PMI not displaced or sustained,S1 and S2 within normal limits, no S3, no S4, no clicks, no rubs, no murmurs ABD:  Flat, positive bowel sounds normal in frequency in pitch, no bruits, no rebound, no guarding, no midline pulsatile mass, no hepatomegaly, no splenomegaly EXT:  2 plus pulses throughout, no edema, no cyanosis no clubbing SKIN:  No rashes no nodules NEURO:  Cranial nerves II through XII grossly intact, motor grossly intact  throughout PSYCH:  Cognitively intact, oriented to person place and time   EKG:  EKG is ordered today. The ekg ordered 06/19/18 demonstrates sinus rhythm.  Rate 61 bpm.  Biatrial enlargement.  08/21/19: Sinus rhythm.  Rate 63 bpm.   08/28/2020: Sinus rhythm.  Rate 61 bpm.  Carotid Doppler 07/25/19:  1-39% ICA stenosis bilaterally.   Echo 07/29/19: IMPRESSIONS   1. Left ventricular ejection fraction, by visual estimation, is 60 to  65%. The left ventricle has normal function. There is mildly increased  left ventricular hypertrophy.  2. The left ventricle has no regional wall motion abnormalities.  3. Global right ventricle has normal systolic function.The right  ventricular size is normal. No increase in right ventricular wall  thickness.  4. Left atrial size was normal.  5. Right atrial size was normal.  6. The mitral  valve is normal in structure. Trivial mitral valve  regurgitation. No evidence of mitral stenosis.  7. The tricuspid valve is normal in structure.  8. The tricuspid valve is normal in structure. Tricuspid valve  regurgitation is not demonstrated.  9. The aortic valve is tricuspid. Aortic valve regurgitation is mild.  Mild to moderate aortic valve sclerosis/calcification without any evidence  of aortic stenosis.  10. Calcified left and non coronary cusps.  11. The pulmonic valve was normal in structure. Pulmonic valve  regurgitation is not visualized.  12. The inferior vena cava is normal in size with greater than 50%  respiratory variability, suggesting right atrial pressure of 3 mmHg.    Recent Labs: 10/08/2019: BUN 17; Creatinine, Ser 0.95; Potassium 5.0; Sodium 134    Lipid Panel    Component Value Date/Time   CHOL 161 01/31/2019 0829   TRIG 77 01/31/2019 0829   HDL 77 01/31/2019 0829   CHOLHDL 2.1 01/31/2019 0829   CHOLHDL 2.1 01/26/2016 1111   VLDL 17 01/26/2016 1111   LDLCALC 69 01/31/2019 0829   07/19/2019: Sodium 131, potassium 4.3, BUN  15, creatinine 0.82 AST 23, ALT 20 WBC 7.2, hemoglobin 13.9, hematocrit 40.1, platelet Total cholesterol 173, triglycerides 121, HDL 79, LDL 60    Wt Readings from Last 3 Encounters:  08/28/20 176 lb 6.4 oz (80 kg)  10/17/19 172 lb (78 kg)  10/08/19 172 lb (78 kg)     ASSESSMENT AND PLAN:  # Coronary calcification: # Hyperlipidemia: # Shortness of breath: Ms. Caccamo was noted to have calcification of the aorta and LCX.  She has no chest pain.  ETT was negative for ischemia 06/2018.  LDL goal less than 70.  Repeat lipids and CMP.  Continue rosuvastatin.  Resume aspirin 81 mg daily.  # Carotid Stenosis:  1-39% stenosis bilaterally.  Continue ASA and statin.  Repeat Dopplers 07/2021.  # Occular migraine:  Ms. [facility] had what is thought to be an occular migraine.  She saw neurology and it was suspected that her symptoms were related to an ocular migraine rather than a TIA.  CT showed mild atherosclerosis but no obstruction.   # Hypertension: # Hyponatremia: Hyponatremia resolved after stopping hydrochlorothiazide.  Blood pressure well have been controlled on current regimen.   Current medicines are reviewed at length with the patient today.  The patient does not have concerns regarding medicines.  The following changes have been made: Increase rosuvastatin  Labs/ tests ordered today include:   Orders Placed This Encounter  Procedures  . Lipid panel  . Comprehensive metabolic panel  . EKG 12-Lead    Disposition:   FU with Maryjayne Kleven C. Oval Linsey, MD, [facility] in 1 year.      Signed, Tayvon Culley C. Oval Linsey, MD, [facility]  08/30/2020 12:05 PM    [facility]

## 2020-08-28 NOTE — Patient Instructions (Signed)
Medication Instructions:  RESTART ASPIRIN 81 MG DAILY  *If you need a refill on your cardiac medications before your next appointment, please call your pharmacy*   Lab Work: FASTING LP/CMET SOON   If you have labs (blood work) drawn today and your tests are completely normal, you will receive your results only by: Marland Kitchen MyChart Message (if you have MyChart) OR . A paper copy in the mail If you have any lab test that is abnormal or we need to change your treatment, we will call you to review the results.  Testing/Procedures: NONE   Follow-Up: At [facility], you and your health needs are our priority.  As part of our continuing mission to provide you with exceptional heart care, we have created designated Provider Care Teams.  These Care Teams include your primary Cardiologist (physician) and Advanced Practice Providers (APPs -  Physician Assistants and Nurse Practitioners) who all work together to provide you with the care you need, when you need it.  We recommend signing up for the patient portal called "MyChart".  Sign up information is provided on this After Visit Summary.  MyChart is used to connect with patients for Virtual Visits (Telemedicine).  Patients are able to view lab/test results, encounter notes, upcoming appointments, etc.  Non-urgent messages can be sent to your provider as well.   To learn more about what you can do with MyChart, go to [email or web address].    Your next appointment:   12 month(s)  The format for your next appointment:   In Person  Provider:   You may see DR [facility] or one of the following Advanced Practice Providers on your designated Care Team:    Kerin Ransom, PA-C  [place], [place]  Coletta Memos, [place]

## 2020-08-30 ENCOUNTER — Encounter: Payer: Self-pay | Admitting: Cardiovascular Disease

## 2020-09-05 LAB — COMPREHENSIVE METABOLIC PANEL
ALT: 16 IU/L (ref 0–32)
AST: 18 IU/L (ref 0–40)
Albumin/Globulin Ratio: 1.5 (ref 1.2–2.2)
Albumin: 4.3 g/dL (ref 3.7–4.7)
Alkaline Phosphatase: 94 IU/L (ref 44–121)
BUN/Creatinine Ratio: 15 (ref 12–28)
BUN: 15 mg/dL (ref 8–27)
Bilirubin Total: 0.4 mg/dL (ref 0.0–1.2)
CO2: 24 mmol/L (ref 20–29)
Calcium: 9.4 mg/dL (ref 8.7–10.3)
Chloride: 96 mmol/L (ref 96–106)
Creatinine, Ser: 1.02 mg/dL — ABNORMAL HIGH (ref 0.57–1.00)
GFR calc Af Amer: 64 mL/min/{1.73_m2} (ref >59)
GFR calc non Af Amer: 55 mL/min/{1.73_m2} — ABNORMAL LOW (ref >59)
Globulin, Total: 2.9 g/dL (ref 1.5–4.5)
Glucose: 80 mg/dL (ref 65–99)
Potassium: 4.3 mmol/L (ref 3.5–5.2)
Sodium: 135 mmol/L (ref 134–144)
Total Protein: 7.2 g/dL (ref 6.0–8.5)

## 2020-09-05 LAB — LIPID PANEL
Chol/HDL Ratio: 2.1 ratio (ref 0.0–4.4)
Cholesterol, Total: 175 mg/dL (ref 100–199)
HDL: 82 mg/dL (ref >39)
LDL Chol Calc (NIH): 78 mg/dL (ref 0–99)
Triglycerides: 81 mg/dL (ref 0–149)
VLDL Cholesterol Cal: 15 mg/dL (ref 5–40)

## 2020-09-28 ENCOUNTER — Telehealth: Payer: Self-pay | Admitting: *Deleted

## 2020-09-28 DIAGNOSIS — Z5181 Encounter for therapeutic drug level monitoring: Secondary | ICD-10-CM

## 2020-09-28 DIAGNOSIS — E785 Hyperlipidemia, unspecified: Secondary | ICD-10-CM

## 2020-09-28 DIAGNOSIS — I10 Essential (primary) hypertension: Secondary | ICD-10-CM

## 2020-09-28 MED ORDER — ROSUVASTATIN CALCIUM 40 MG PO TABS: 40.0000 mg | ORAL_TABLET | Freq: Every day | ORAL | 3 refills | Status: DC

## 2020-09-28 NOTE — Telephone Encounter (Signed)
-----   Message from Skeet Latch, MD sent at 09/23/2020  9:10 AM EDT ----- Cholesterol is better but her LDL is still not under 70.  Recommend increasing rosuvastatin to 40 mg and repeating lipids and a CMP in 3 months.

## 2020-09-28 NOTE — Telephone Encounter (Signed)
Advised patient of lab results, orders placed, and Rx sent to pharmacy

## 2020-10-25 ENCOUNTER — Other Ambulatory Visit: Payer: Self-pay | Admitting: Cardiovascular Disease

## 2020-11-02 ENCOUNTER — Ambulatory Visit: Payer: Medicare PPO | Admitting: Dermatology

## 2020-11-02 ENCOUNTER — Encounter: Payer: Self-pay | Admitting: Dermatology

## 2020-11-02 ENCOUNTER — Other Ambulatory Visit: Payer: Self-pay

## 2020-11-02 DIAGNOSIS — D2339 Other benign neoplasm of skin of other parts of face: Secondary | ICD-10-CM | POA: Diagnosis not present

## 2020-11-02 DIAGNOSIS — D1801 Hemangioma of skin and subcutaneous tissue: Secondary | ICD-10-CM | POA: Diagnosis not present

## 2020-11-02 DIAGNOSIS — D239 Other benign neoplasm of skin, unspecified: Secondary | ICD-10-CM

## 2020-11-02 DIAGNOSIS — Z1283 Encounter for screening for malignant neoplasm of skin: Secondary | ICD-10-CM | POA: Diagnosis not present

## 2020-11-02 DIAGNOSIS — L821 Other seborrheic keratosis: Secondary | ICD-10-CM | POA: Diagnosis not present

## 2020-11-12 ENCOUNTER — Encounter: Payer: Self-pay | Admitting: Dermatology

## 2020-11-13 NOTE — Progress Notes (Signed)
   Follow-Up Visit   Subjective  Candice Saunders is a 70 y.o. female who presents for the following: Annual Exam (No new concerns).  General skin examination Location:  Duration:  Quality:  Associated Signs/Symptoms: Modifying Factors:  Severity:  Timing: Context:   Objective  Well appearing patient in no apparent distress; mood and affect are within normal limits. Objective  Left Inguinal Area: No previous skin cancer or atypical moles, also clear today  Objective  Mid Back: Arms, back and chest: 1 to 2 mm smooth red papules  Objective  Mid Back: 4 to 6 mm textured brown flattopped papules  Objective  Mid Forehead: Tiny pit compatible with pore of Winer    A full examination was performed including scalp, head, eyes, ears, nose, lips, neck, chest, axillae, abdomen, back, buttocks, bilateral upper extremities, bilateral lower extremities, hands, feet, fingers, toes, fingernails, and toenails. All findings within normal limits unless otherwise noted below.  Areas beneath undergarments not fully examined Assessment & Plan    Screening exam for skin cancer Left Inguinal Area  Yearly skin exams  Cherry angioma Mid Back  No intervention necessary  Seborrheic keratosis Mid Back  Leave if stable  Dilated pore of Winer Mid Forehead  Stable safe to leave      I, Lavonna Monarch, MD, have reviewed all documentation for this visit.  The documentation on 11/13/20 for the exam, diagnosis, procedures, and orders are all accurate and complete.

## 2021-02-12 LAB — COMPREHENSIVE METABOLIC PANEL
ALT: 12 IU/L (ref 0–32)
AST: 18 IU/L (ref 0–40)
Albumin/Globulin Ratio: 1.5 (ref 1.2–2.2)
Albumin: 4.3 g/dL (ref 3.7–4.7)
Alkaline Phosphatase: 101 IU/L (ref 44–121)
BUN/Creatinine Ratio: 15 (ref 12–28)
BUN: 11 mg/dL (ref 8–27)
Bilirubin Total: 0.4 mg/dL (ref 0.0–1.2)
CO2: 24 mmol/L (ref 20–29)
Calcium: 9.1 mg/dL (ref 8.7–10.3)
Chloride: 96 mmol/L (ref 96–106)
Creatinine, Ser: 0.74 mg/dL (ref 0.57–1.00)
Globulin, Total: 2.8 g/dL (ref 1.5–4.5)
Glucose: 85 mg/dL (ref 65–99)
Potassium: 5.5 mmol/L — ABNORMAL HIGH (ref 3.5–5.2)
Sodium: 133 mmol/L — ABNORMAL LOW (ref 134–144)
Total Protein: 7.1 g/dL (ref 6.0–8.5)
eGFR: 86 mL/min/{1.73_m2} (ref >59)

## 2021-02-12 LAB — LIPID PANEL
Chol/HDL Ratio: 2.2 ratio (ref 0.0–4.4)
Cholesterol, Total: 173 mg/dL (ref 100–199)
HDL: 80 mg/dL (ref >39)
LDL Chol Calc (NIH): 79 mg/dL (ref 0–99)
Triglycerides: 72 mg/dL (ref 0–149)
VLDL Cholesterol Cal: 14 mg/dL (ref 5–40)

## 2021-02-27 ENCOUNTER — Telehealth: Payer: Medicare PPO | Admitting: Family

## 2021-02-27 ENCOUNTER — Encounter: Payer: Self-pay | Admitting: Family

## 2021-02-27 DIAGNOSIS — U071 COVID-19: Secondary | ICD-10-CM

## 2021-02-27 MED ORDER — MOLNUPIRAVIR EUA 200MG CAPSULE
4.0000 | ORAL_CAPSULE | Freq: Two times a day (BID) | ORAL | 0 refills | Status: AC
Start: 2021-02-27 — End: 2021-03-04

## 2021-02-27 NOTE — Progress Notes (Signed)
Virtual Visit Consent   Candice Saunders, you are scheduled for a virtual visit with a [facility] provider today.     Just as with appointments in the office, your consent must be obtained to participate.  Your consent will be active for this visit and any virtual visit you may have with one of our providers in the next 365 days.     If you have a MyChart account, a copy of this consent can be sent to you electronically.  All virtual visits are billed to your insurance company just like a traditional visit in the office.    As this is a virtual visit, video technology does not allow for your provider to perform a traditional examination.  This may limit your provider's ability to fully assess your condition.  If your provider identifies any concerns that need to be evaluated in person or the need to arrange testing (such as labs, EKG, etc.), we will make arrangements to do so.     Although advances in technology are sophisticated, we cannot ensure that it will always work on either your end or our end.  If the connection with a video visit is poor, the visit may have to be switched to a telephone visit.  With either a video or telephone visit, we are not always able to ensure that we have a secure connection.     I need to obtain your verbal consent now.   Are you willing to proceed with your visit today?    [name] has provided verbal consent on 02/27/2021 for a virtual visit (video or telephone).   Evelina Dun, FNP   Date: 02/27/2021 7:23 PM   Virtual Visit via Video Note   I, Evelina Dun, connected with  Candice Saunders  ([id number], 1950/07/23) on 02/27/21 at  7:30 PM EDT by a video-enabled telemedicine application and verified that I am speaking with the correct person using two identifiers.  Location: Patient: Virtual Visit Location Patient: Home Provider: Virtual Visit Location Provider: Home   I discussed the limitations of evaluation and management by  telemedicine and the availability of in person appointments. The patient expressed understanding and agreed to proceed.    History of Present Illness: Candice Saunders is a 70 y.o. who identifies as a female who was assigned female at birth, and is being seen today for [place]. She reports her symptoms started today and tested positive today.   HPI: Fever  This is a new problem. The current episode started today. The maximum temperature noted was 101 to 101.9 F. Associated symptoms include muscle aches, sleepiness and a sore throat. Pertinent negatives include no congestion, coughing, diarrhea, ear pain or headaches. She has tried acetaminophen for the symptoms. The treatment provided mild relief.   Problems:  Patient Active Problem List   Diagnosis Date Noted   Carotid stenosis 08/21/2019   Hyponatremia 08/21/2019   Ocular migraine 08/21/2019   Coronary artery calcification seen on CAT scan 06/19/2018   Essential hypertension 06/19/2018   Centrilobular emphysema ([place]) 06/19/2018   Nodule of upper lobe of left lung 01/10/2018   Extra mammary Paget disease 07/07/2014   Seasonal allergies 09/29/2011   Osteopenia 09/29/2011   Osteoporosis 09/29/2011   Dyslipidemia 09/29/2011   Hx of colonic polyps 09/29/2011    Allergies: No Known Allergies Medications:  Current Outpatient Medications:    molnupiravir EUA 200 mg CAPS, Take 4 capsules (800 mg total) by mouth 2 (two) times daily for 5  days., Disp: 40 capsule, Rfl: 0   amLODipine (NORVASC) 2.5 MG tablet, TAKE 1 TABLET(2.5 MG) BY MOUTH DAILY, Disp: 90 tablet, Rfl: 3   aspirin EC 81 MG tablet, Take 81 mg by mouth daily. Swallow whole., Disp: , Rfl:    Cholecalciferol (VITAMIN D3) 3000 units TABS, Take by mouth., Disp: , Rfl:    Multiple Vitamins-Minerals (PRESERVISION AREDS 2 PO), Take by mouth., Disp: , Rfl:    nebivolol (BYSTOLIC) 2.5 MG tablet, TAKE 1 TABLET(2.5 MG) BY MOUTH DAILY, Disp: 30 tablet, Rfl: 10   rosuvastatin (CRESTOR) 40 MG  tablet, Take 1 tablet (40 mg total) by mouth daily., Disp: 90 tablet, Rfl: 3  Observations/Objective: Patient is well-developed, well-nourished in no acute distress.  Resting comfortably  at home.  Head is normocephalic, atraumatic.  No labored breathing.  Speech is clear and coherent with logical content.  Patient is alert and oriented at baseline.  No cough or distress noted  Assessment and Plan: 1. COVID-19 virus detected - molnupiravir EUA 200 mg CAPS; Take 4 capsules (800 mg total) by mouth 2 (two) times daily for 5 days.  Dispense: 40 capsule; Refill: 0 COVID positive, rest, force fluids, tylenol as needed, Quarantine for at least 5 days and fever free, report any worsening symptoms such as increased shortness of breath, swelling, or continued high fevers.  Possible adverse effects discussed   Follow Up Instructions: I discussed the assessment and treatment plan with the patient. The patient was provided an opportunity to ask questions and all were answered. The patient agreed with the plan and demonstrated an understanding of the instructions.  A copy of instructions were sent to the patient via MyChart.  The patient was advised to call back or seek an in-person evaluation if the symptoms worsen or if the condition fails to improve as anticipated.  Time:  I spent 8 minutes with the patient via telehealth technology discussing the above problems/concerns.    Evelina Dun, FNP

## 2021-03-03 ENCOUNTER — Other Ambulatory Visit (HOSPITAL_BASED_OUTPATIENT_CLINIC_OR_DEPARTMENT_OTHER): Payer: Self-pay | Admitting: *Deleted

## 2021-03-03 ENCOUNTER — Encounter (INDEPENDENT_AMBULATORY_CARE_PROVIDER_SITE_OTHER): Payer: Medicare PPO | Admitting: Ophthalmology

## 2021-03-03 DIAGNOSIS — I10 Essential (primary) hypertension: Secondary | ICD-10-CM

## 2021-03-03 DIAGNOSIS — E875 Hyperkalemia: Secondary | ICD-10-CM

## 2021-03-22 ENCOUNTER — Other Ambulatory Visit: Payer: Self-pay

## 2021-03-22 ENCOUNTER — Encounter (INDEPENDENT_AMBULATORY_CARE_PROVIDER_SITE_OTHER): Payer: Medicare PPO | Admitting: Ophthalmology

## 2021-03-22 DIAGNOSIS — H35033 Hypertensive retinopathy, bilateral: Secondary | ICD-10-CM | POA: Diagnosis not present

## 2021-03-22 DIAGNOSIS — I10 Essential (primary) hypertension: Secondary | ICD-10-CM | POA: Diagnosis not present

## 2021-03-22 DIAGNOSIS — H353132 Nonexudative age-related macular degeneration, bilateral, intermediate dry stage: Secondary | ICD-10-CM | POA: Diagnosis not present

## 2021-03-22 DIAGNOSIS — H43813 Vitreous degeneration, bilateral: Secondary | ICD-10-CM | POA: Diagnosis not present

## 2021-03-25 ENCOUNTER — Other Ambulatory Visit: Payer: Self-pay | Admitting: Family Medicine

## 2021-03-25 DIAGNOSIS — Z87891 Personal history of nicotine dependence: Secondary | ICD-10-CM

## 2021-05-06 LAB — BASIC METABOLIC PANEL
BUN/Creatinine Ratio: 18 (ref 12–28)
BUN: 17 mg/dL (ref 8–27)
CO2: 21 mmol/L (ref 20–29)
Calcium: 9.2 mg/dL (ref 8.7–10.3)
Chloride: 95 mmol/L — ABNORMAL LOW (ref 96–106)
Creatinine, Ser: 0.96 mg/dL (ref 0.57–1.00)
Glucose: 85 mg/dL (ref 70–99)
Potassium: 4.9 mmol/L (ref 3.5–5.2)
Sodium: 134 mmol/L (ref 134–144)
eGFR: 63 mL/min/{1.73_m2} (ref >59)

## 2021-06-30 ENCOUNTER — Other Ambulatory Visit: Payer: Self-pay | Admitting: Cardiovascular Disease

## 2021-07-15 ENCOUNTER — Ambulatory Visit
Admission: RE | Admit: 2021-07-15 | Discharge: 2021-07-15 | Disposition: A | Discharge status: Home / Self Care | Payer: Medicare PPO | Source: Ambulatory Visit | Attending: Family Medicine | Admitting: Family Medicine

## 2021-07-15 DIAGNOSIS — Z87891 Personal history of nicotine dependence: Secondary | ICD-10-CM

## 2021-07-28 ENCOUNTER — Other Ambulatory Visit: Payer: Self-pay | Admitting: Cardiovascular Disease

## 2021-10-15 ENCOUNTER — Ambulatory Visit (HOSPITAL_BASED_OUTPATIENT_CLINIC_OR_DEPARTMENT_OTHER): Payer: Medicare PPO | Admitting: Cardiovascular Disease

## 2021-10-15 ENCOUNTER — Encounter (HOSPITAL_BASED_OUTPATIENT_CLINIC_OR_DEPARTMENT_OTHER): Payer: Self-pay | Admitting: Cardiovascular Disease

## 2021-10-15 VITALS — BP 132/76 | HR 62 | Ht 63.0 in | Wt 178.0 lb

## 2021-10-15 DIAGNOSIS — I10 Essential (primary) hypertension: Secondary | ICD-10-CM

## 2021-10-15 DIAGNOSIS — I2510 Atherosclerotic heart disease of native coronary artery without angina pectoris: Secondary | ICD-10-CM

## 2021-10-15 DIAGNOSIS — R911 Solitary pulmonary nodule: Secondary | ICD-10-CM

## 2021-10-15 DIAGNOSIS — E785 Hyperlipidemia, unspecified: Secondary | ICD-10-CM

## 2021-10-15 DIAGNOSIS — I6523 Occlusion and stenosis of bilateral carotid arteries: Secondary | ICD-10-CM

## 2021-10-15 NOTE — Assessment & Plan Note (Signed)
Candice Saunders was noted to have calcification of the aorta and LCX.  She has no chest pain.  ETT was negative for ischemia 06/2018.  She is exercising and has no ischemia.  She was encouraged to increase her cardio.  Lipids are reasonably well controlled.  Would like her LDL to be less than 70 and most recently was 79.  Continue rosuvastatin. ?

## 2021-10-15 NOTE — Assessment & Plan Note (Signed)
Continue rosuvastatin.  

## 2021-10-15 NOTE — Progress Notes (Signed)
? ? ?Cardiology Office Note ? ?Date:  10/15/2021  ? ?ID:  JNYA BROSSARD, DOB 1951-03-26, MRN [id number] ? ?PCP:  Hayden Rasmussen, MD  ?Cardiologist:   Skeet Latch, MD  ? ?No chief complaint on file. ? ? ?History of Present Illness: ?Candice Saunders is a 71 y.o. female with hypertension, hyperlipidemia, mild carotid stenois, asymptomatic coronary calcification, COPD, and prior tobacco abuse here for follow up.  She was initially seen 06/2018 for the evaluation of atherosclerosis.  Candice Saunders had a chest CT for lung cancer screening on 05/01/2018.  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  She also had pulmonary nodules, emphysema and COPD.  She was referred to cardiology for further evaluation.  Candice Saunders was feeling well but did have some shortness of breath when walking up hills.  Given these findings she was referred for an ETT that was negative for ischemia.  She achieved 7 METS on a Bruce protocol.  She was started on aspirin and rosuvastatin.  Since then she has felt generally well.  However she did have an episode of transient aphasia.  She saw Dr. Darron Doom and was referred for carotid Dopplers that revealed mild stenosis bilaterally.  Echocardiogram 07/2019 revealed LVEF 60 to 65% with mild LVH and aortic sclerosis without stenosis.  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  She is scheduled to see a neurologist soon.  She has not been able to exercise much lately due to plantar fasciitis.  She denies exertional chest pain or shortness of breath.  She has noticed that on her labs her sodium has been running in the 130s for the last year or two. ? ?Hydrochlorothiazide was switched to amlodipine due to hypokalemia.  She followed up with our pharmacist and was doing well.  She is doing strength and balance training twice per week.  She has no exertional chest pain or shortness of breath typically.  She gets a little short of breath going up an incline, but this  is stable.  She denies LE edema, orthopnea or PND.   She is otherwise well.  She hasn't been checking her BP much lately because it has generally been controlled.  ? ? ?Past Medical History:  ?Diagnosis Date  ? Allergy   ? Carotid stenosis 08/21/2019  ? Coronary artery calcification seen on CAT scan 06/19/2018  ? Depression   ? Diverticulosis   ? Essential hypertension 06/19/2018  ? Exertional dyspnea 06/19/2018  ? Extra mammary Paget disease   ? 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 peri-anal region; s/p large excision by [facility]orectal Surgery/[facility]  ? Hx of colonic polyps 09/29/2011  ? Hyperlipidemia   ? Hyponatremia 08/21/2019  ? Macular degeneration 10/09/2012  ? [facility]/Retinal specialist  ? 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 Osteopenia 09/29/2011  ? Osteoporosis   ? ? ?Past Surgical History:  ?Procedure Laterality Date  ? Admission  07/11/2002  ? GI bleeding due to polyp.    ? COLONOSCOPY W/ POLYPECTOMY  11/09/2010  ? Buccini; every 2-3 years  ? extramammary paget's resection perianal region 07/06/2014 [facility] Colorectal surgery/[facility]  07/06/2014  ? EYE SURGERY    ? Cataract resection  ? ROTATOR CUFF REPAIR  01/08/2005  ? Right  ? ? ? ?Current Outpatient Medications  ?Medication Sig Dispense Refill  ? amLODipine (NORVASC) 2.5 MG tablet TAKE 1 TABLET(2.5 MG) BY MOUTH DAILY 90 tablet 3  ? Cholecalciferol (VITAMIN D3) 3000 units TABS Take by mouth.    ?  Multiple Vitamins-Minerals (PRESERVISION AREDS 2 PO) Take by mouth.    ? nebivolol (BYSTOLIC) 2.5 MG tablet TAKE 1 TABLET(2.5 MG) BY MOUTH DAILY 30 tablet 10  ? rosuvastatin (CRESTOR) 40 MG tablet Take 1 tablet (40 mg total) by mouth daily. 90 tablet 3  ? ?No current facility-administered medications for this visit.  ? ? ?Allergies:   Patient has no known allergies.  ? ? ?Social History:  The patient  reports that she quit smoking about 15 years ago. Her smoking use included cigarettes. She has never used smokeless tobacco. She reports current alcohol use of about 18.0  standard drinks per week. She reports that she does not use drugs.  ? ?Family History:  The patient's family history includes Alzheimer's disease in her mother; Breast cancer in her mother; Cancer in her maternal grandmother; Cancer (age of onset: 58) in her sister; Cancer (age of onset: 96) in her mother; Heart attack in her father; Heart disease (age of onset: 64) in her father; Heart failure in her father; Hyperlipidemia in her father and sister; Hypertension in her father and sister; Kidney disease (age of onset: 68) in her father; Osteoporosis in her mother; Stroke in her mother; Stroke (age of onset: 75) in her brother.  ? ? ?ROS:  Please see the history of present illness.   Otherwise, review of systems are positive for none.   All other systems are reviewed and negative.  ? ? ?PHYSICAL EXAM: ?VS:  BP 132/76   Pulse 62   Ht '5\' 3"'$  (1.6 m)   Wt 178 lb (80.7 kg)   BMI 31.53 kg/m?  , BMI Body mass index is 31.53 kg/m?. ?GENERAL:  Well appearing ?HEENT: Pupils equal round and reactive, fundi not visualized, oral mucosa unremarkable ?NECK:  No jugular venous distention, waveform within normal limits, carotid upstroke brisk and symmetric, no bruits ?LUNGS:  Clear to auscultation bilaterally ?HEART:  RRR.  PMI not displaced or sustained,S1 and S2 within normal limits, no S3, no S4, no clicks, no rubs, no murmurs ?ABD:  Flat, positive bowel sounds normal in frequency in pitch, no bruits, no rebound, no guarding, no midline pulsatile mass, no hepatomegaly, no splenomegaly ?EXT:  2 plus pulses throughout, no edema, no cyanosis no clubbing ?SKIN:  No rashes no nodules ?NEURO:  Cranial nerves II through XII grossly intact, motor grossly intact throughout ?PSYCH:  Cognitively intact, oriented to person place and time ? ? ?EKG:  EKG is ordered today. ?The ekg ordered 06/19/18 demonstrates sinus rhythm.  Rate 61 bpm.  Biatrial enlargement.  ?08/21/19: Sinus rhythm.  Rate 63 bpm.   ?08/28/2020: Sinus rhythm.  Rate 61  bpm. ?10/15/21: Sinus rhythm.  Rate 62 bpm.  ? ?Carotid Doppler 07/25/19:  ?1-39% ICA stenosis bilaterally. ?  ?Echo 07/29/19: ?IMPRESSIONS  ? ? 1. Left ventricular ejection fraction, by visual estimation, is 60 to  ?65%. The left ventricle has normal function. There is mildly increased  ?left ventricular hypertrophy.  ? 2. The left ventricle has no regional wall motion abnormalities.  ? 3. Global right ventricle has normal systolic function.The right  ?ventricular size is normal. No increase in right ventricular wall  ?thickness.  ? 4. Left atrial size was normal.  ? 5. Right atrial size was normal.  ? 6. The mitral valve is normal in structure. Trivial mitral valve  ?regurgitation. No evidence of mitral stenosis.  ? 7. The tricuspid valve is normal in structure.  ? 8. The tricuspid valve is normal in structure. Tricuspid  valve  ?regurgitation is not demonstrated.  ? 9. The aortic valve is tricuspid. Aortic valve regurgitation is mild.  ?Mild to moderate aortic valve sclerosis/calcification without any evidence  ?of aortic stenosis.  ?10. Calcified left and non coronary cusps.  ?11. The pulmonic valve was normal in structure. Pulmonic valve  ?regurgitation is not visualized.  ?12. The inferior vena cava is normal in size with greater than 50%  ?respiratory variability, suggesting right atrial pressure of 3 mmHg.  ? ? ?Recent Labs: ?02/12/2021: ALT 12 ?05/05/2021: BUN 17; Creatinine, Ser 0.96; Potassium 4.9; Sodium 134  ? ? ?Lipid Panel ?   ?Component Value Date/Time  ? CHOL 173 02/12/2021 0902  ? TRIG 72 02/12/2021 0902  ? HDL 80 02/12/2021 0902  ? CHOLHDL 2.2 02/12/2021 0902  ? CHOLHDL 2.1 01/26/2016 1111  ? VLDL 17 01/26/2016 1111  ? [place] 79 02/12/2021 0902  ? ?07/19/2019: ?Sodium 131, potassium 4.3, BUN 15, creatinine 0.82 ?AST 23, ALT 20 ?WBC 7.2, hemoglobin 13.9, hematocrit 40.1, platelet ?Total cholesterol 173, triglycerides 121, HDL 79, LDL 60 ?  ? ?Wt Readings from Last 3 Encounters:  ?10/15/21 178 lb (80.7 kg)   ?08/28/20 176 lb 6.4 oz (80 kg)  ?10/17/19 172 lb (78 kg)  ?  ? ?ASSESSMENT AND PLAN: ? ?Coronary artery calcification seen on CAT scan ?Candice Saunders was noted to have calcification of the aorta and LCX.  She has n

## 2021-10-15 NOTE — Assessment & Plan Note (Signed)
Mild bilateral ICA stenosis.  Continue statin as above. ?

## 2021-10-15 NOTE — Assessment & Plan Note (Signed)
Pulmonary nodules noted on prior CT imaging.  Recommended 3-monthfollow-up.  We will repeat now. ?

## 2021-10-15 NOTE — Assessment & Plan Note (Signed)
Blood pressure nearly at her goal of less than 130/80.  She is going to track at home and to make sure it is controlled.  Continue amlodipine and nebivolol. ?

## 2021-10-15 NOTE — Patient Instructions (Signed)
Medication Instructions:  ?Your physician recommends that you continue on your current medications as directed. Please refer to the Current Medication list given to you today. ? ?*If you need a refill on your cardiac medications before your next appointment, please call your pharmacy* ? ?Lab Work: ?NONE ? ?Testing/Procedures: ?CHEST CT  ? ?Follow-Up: ?At [facility], you and your health needs are our priority.  As part of our continuing mission to provide you with exceptional heart care, we have created designated Provider Care Teams.  These Care Teams include your primary Cardiologist (physician) and Advanced Practice Providers (APPs -  Physician Assistants and Nurse Practitioners) who all work together to provide you with the care you need, when you need it. ? ?We recommend signing up for the patient portal called "MyChart".  Sign up information is provided on this After Visit Summary.  MyChart is used to connect with patients for Virtual Visits (Telemedicine).  Patients are able to view lab/test results, encounter notes, upcoming appointments, etc.  Non-urgent messages can be sent to your provider as well.   ?To learn more about what you can do with MyChart, go to [email or web address].   ? ?Your next appointment:   ?12 month(s) ? ?The format for your next appointment:   ?In Person ? ?Provider:   ?Skeet Latch, MD{ ? ? ?

## 2021-10-19 ENCOUNTER — Other Ambulatory Visit: Payer: Self-pay | Admitting: *Deleted

## 2021-10-19 DIAGNOSIS — Z5181 Encounter for therapeutic drug level monitoring: Secondary | ICD-10-CM

## 2021-10-19 DIAGNOSIS — E875 Hyperkalemia: Secondary | ICD-10-CM

## 2021-10-19 DIAGNOSIS — I2510 Atherosclerotic heart disease of native coronary artery without angina pectoris: Secondary | ICD-10-CM

## 2021-10-19 DIAGNOSIS — E785 Hyperlipidemia, unspecified: Secondary | ICD-10-CM

## 2021-10-19 LAB — COMPREHENSIVE METABOLIC PANEL
ALT: 10 IU/L (ref 0–32)
AST: 20 IU/L (ref 0–40)
Albumin/Globulin Ratio: 1.9 (ref 1.2–2.2)
Albumin: 4.4 g/dL (ref 3.7–4.7)
Alkaline Phosphatase: 93 IU/L (ref 44–121)
BUN/Creatinine Ratio: 17 (ref 12–28)
BUN: 14 mg/dL (ref 8–27)
Bilirubin Total: 0.4 mg/dL (ref 0.0–1.2)
CO2: 23 mmol/L (ref 20–29)
Calcium: 9.1 mg/dL (ref 8.7–10.3)
Chloride: 96 mmol/L (ref 96–106)
Creatinine, Ser: 0.81 mg/dL (ref 0.57–1.00)
Globulin, Total: 2.3 g/dL (ref 1.5–4.5)
Glucose: 91 mg/dL (ref 70–99)
Potassium: 4.7 mmol/L (ref 3.5–5.2)
Sodium: 133 mmol/L — ABNORMAL LOW (ref 134–144)
Total Protein: 6.7 g/dL (ref 6.0–8.5)
eGFR: 77 mL/min/{1.73_m2} (ref >59)

## 2021-10-19 LAB — LIPID PANEL
Chol/HDL Ratio: 2.3 ratio (ref 0.0–4.4)
Cholesterol, Total: 156 mg/dL (ref 100–199)
HDL: 68 mg/dL (ref >39)
LDL Chol Calc (NIH): 73 mg/dL (ref 0–99)
Triglycerides: 79 mg/dL (ref 0–149)
VLDL Cholesterol Cal: 15 mg/dL (ref 5–40)

## 2021-10-19 NOTE — Progress Notes (Unsigned)
Patient  needing labs obtained today  ?

## 2021-10-27 ENCOUNTER — Other Ambulatory Visit: Payer: Self-pay | Admitting: Cardiovascular Disease

## 2021-10-28 NOTE — Telephone Encounter (Signed)
Rx(s) sent to pharmacy electronically.  

## 2021-11-01 ENCOUNTER — Encounter (HOSPITAL_BASED_OUTPATIENT_CLINIC_OR_DEPARTMENT_OTHER): Payer: Self-pay

## 2021-11-01 ENCOUNTER — Ambulatory Visit (HOSPITAL_BASED_OUTPATIENT_CLINIC_OR_DEPARTMENT_OTHER)
Admission: RE | Admit: 2021-11-01 | Discharge: 2021-11-01 | Disposition: A | Discharge status: Home / Self Care | Payer: Medicare PPO | Source: Ambulatory Visit | Attending: Cardiovascular Disease | Admitting: Cardiovascular Disease

## 2021-11-01 DIAGNOSIS — R911 Solitary pulmonary nodule: Secondary | ICD-10-CM | POA: Insufficient documentation

## 2021-11-02 ENCOUNTER — Ambulatory Visit: Payer: Medicare PPO | Admitting: Dermatology

## 2021-11-02 ENCOUNTER — Encounter: Payer: Self-pay | Admitting: Dermatology

## 2021-11-02 DIAGNOSIS — L729 Follicular cyst of the skin and subcutaneous tissue, unspecified: Secondary | ICD-10-CM

## 2021-11-02 DIAGNOSIS — L821 Other seborrheic keratosis: Secondary | ICD-10-CM

## 2021-11-02 DIAGNOSIS — D1801 Hemangioma of skin and subcutaneous tissue: Secondary | ICD-10-CM

## 2021-11-02 DIAGNOSIS — Z1283 Encounter for screening for malignant neoplasm of skin: Secondary | ICD-10-CM

## 2021-11-20 ENCOUNTER — Encounter: Payer: Self-pay | Admitting: Dermatology

## 2021-11-20 NOTE — Progress Notes (Signed)
? ?  Follow-Up Visit ?  ?Subjective  ?Candice Saunders is a 71 y.o. female who presents for the following: Annual Exam (Full body skin exam. No new concerns. ). ? ?General skin check ?Location:  ?Duration:  ?Quality:  ?Associated Signs/Symptoms: ?Modifying Factors:  ?Severity:  ?Timing: ?Context:  ? ?Objective  ?Well appearing patient in no apparent distress; mood and affect are within normal limits. ?General skin check: No atypical pigmented lesions (all checked with dermoscopy) or nonmelanoma skin cancer. ? ?Mid Forehead ?1 cm noninflamed deep dermal nodule ? ?Left Upper Arm - Anterior, Right Upper Arm - Anterior ?Several 4 to 8 mm light brown textured flattopped papules, compatible dermoscopy ? ?Abdomen (Lower Torso, Anterior) ?Multiple 1 mm smooth red dermal papules ? ? ? ?A full examination was performed including scalp, head, eyes, ears, nose, lips, neck, chest, axillae, abdomen, back, buttocks, bilateral upper extremities, bilateral lower extremities, hands, feet, fingers, toes, fingernails, and toenails. All findings within normal limits unless otherwise noted below.  Areas beneath undergarments not fully examined ? ? ?Assessment & Plan  ? ? ?Cyst of skin ?Mid Forehead ? ?Discussed elective excision, no procedure scheduled ? ?Skin exam for malignant neoplasm ? ?Annual skin examination, encouraged to self examine twice annually. ? ?Seborrheic keratosis (2) ?Left Upper Arm - Anterior; Right Upper Arm - Anterior ? ?Leave if stable ? ?Hemangioma of skin ?Abdomen (Lower Torso, Anterior) ? ?No intervention necessary ? ? ? ? ? ?I, Lavonna Monarch, MD, have reviewed all documentation for this visit.  The documentation on 11/20/21 for the exam, diagnosis, procedures, and orders are all accurate and complete. ?

## 2022-08-16 ENCOUNTER — Other Ambulatory Visit: Payer: Self-pay | Admitting: Cardiovascular Disease

## 2022-08-16 NOTE — Telephone Encounter (Signed)
Rx request sent to pharmacy.  

## 2022-08-19 IMAGING — CT CT CHEST LUNG CANCER SCREENING LOW DOSE W/O CM
1 of 2 series · 15 of 32 positions shown, 19 images · non-contrast
Comparison: 05/01/2018

CLINICAL DATA: Lung cancer screening. Thirty-eight pack-year
history. Former asymptomatic smoker.

EXAM:
CT CHEST WITHOUT CONTRAST LOW-DOSE FOR LUNG CANCER SCREENING
TECHNIQUE: Multidetector CT imaging of the chest was performed following the
standard protocol without IV contrast.

[Series 5: ldct screen-lung · axial · 0.67mm/px · z∈[-302,-14]mm · 15 of 319 slices shown, 19 images]
[im 15/319  mediastinal]
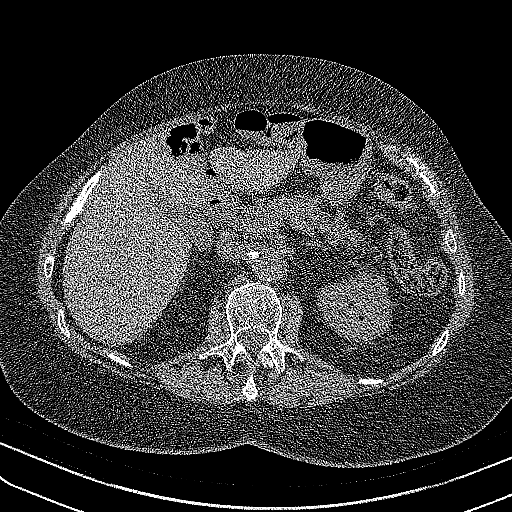
[im 15/319  lung]
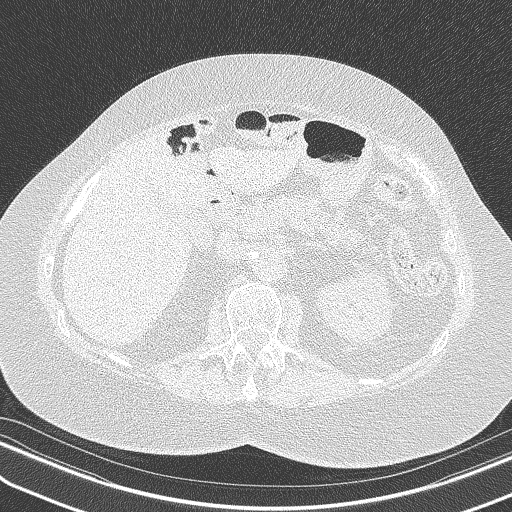
[im 44/319  lung]
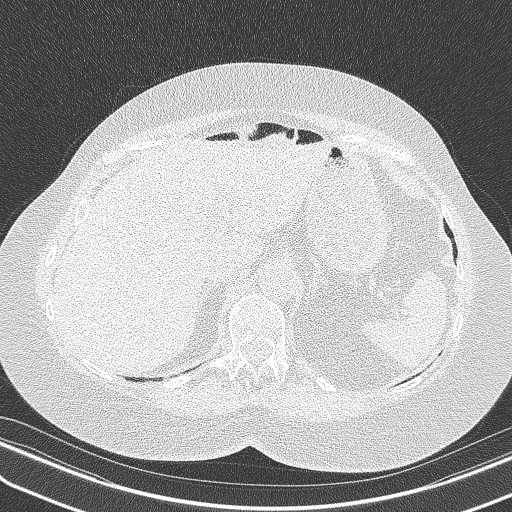
[im 73/319  lung]
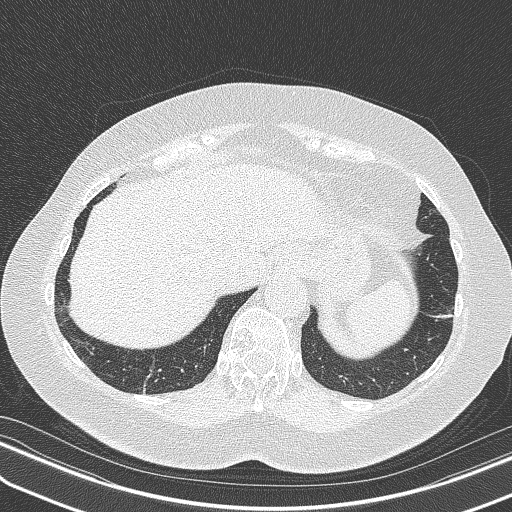
[im 80/319  lung]
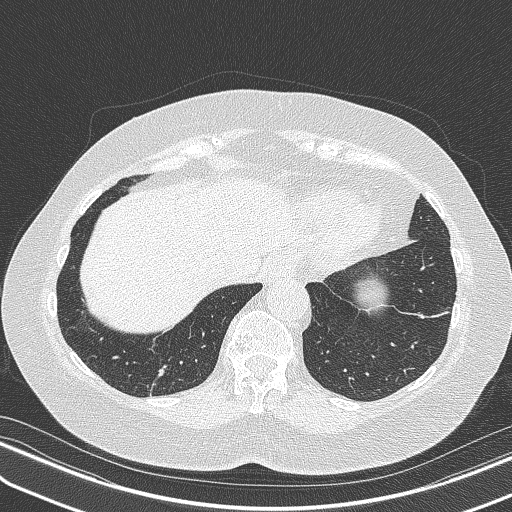
[im 102/319  mediastinal]
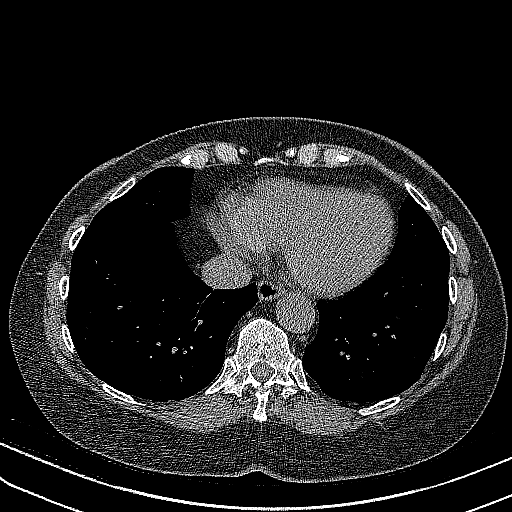
[im 102/319  lung]
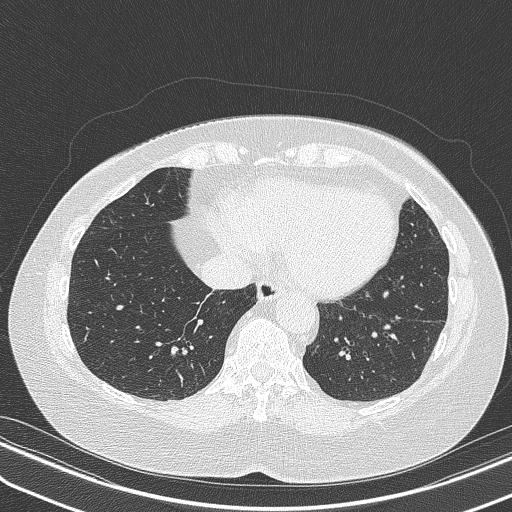
[im 116/319  lung]
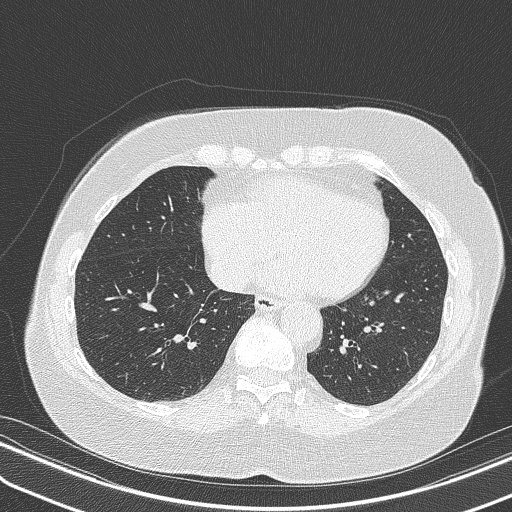
[im 145/319  lung]
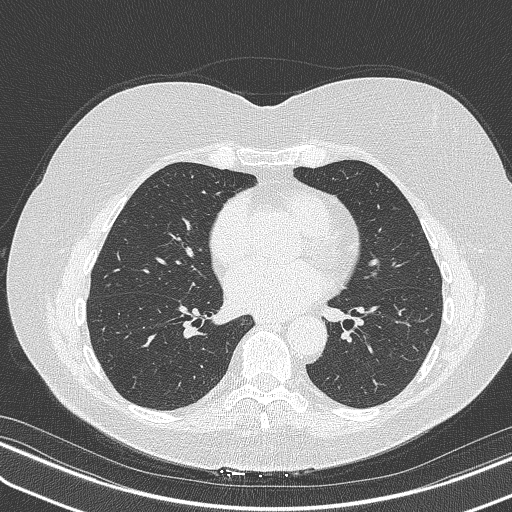
[im 160/319  lung]
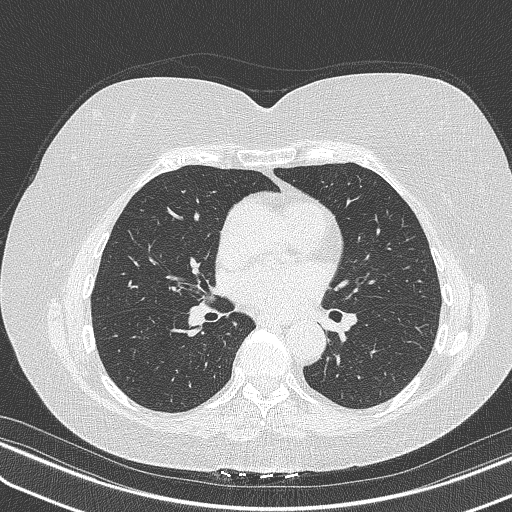
[im 174/319  mediastinal]
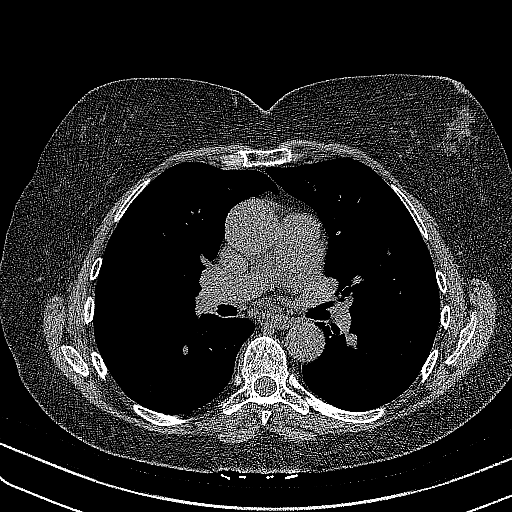
[im 174/319  lung]
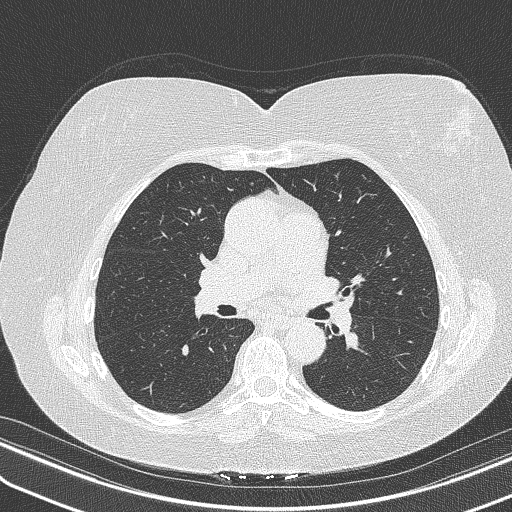
[im 203/319  lung]
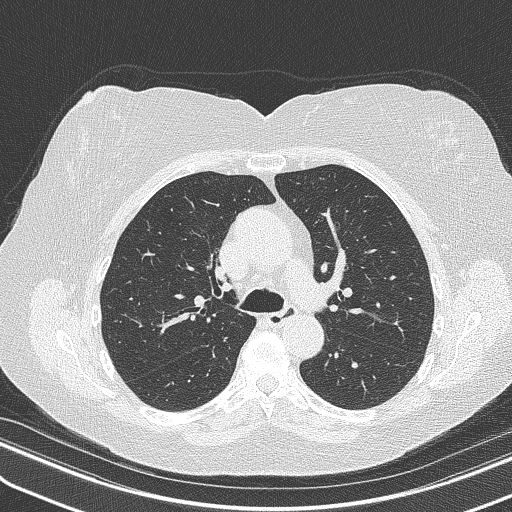
[im 217/319  lung]
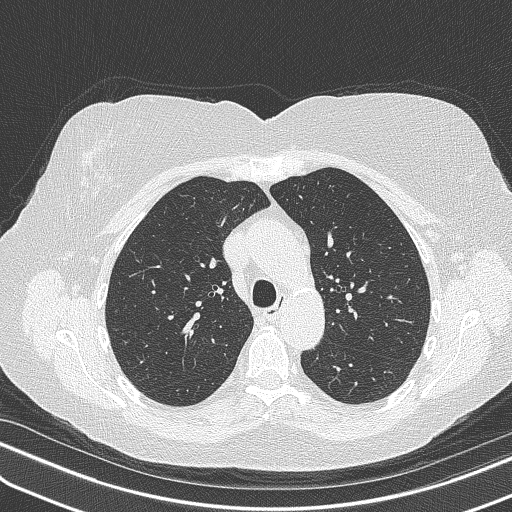
[im 239/319  lung]
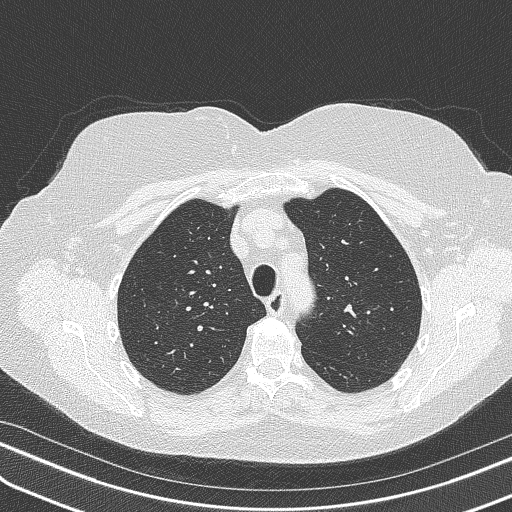
[im 261/319  mediastinal]
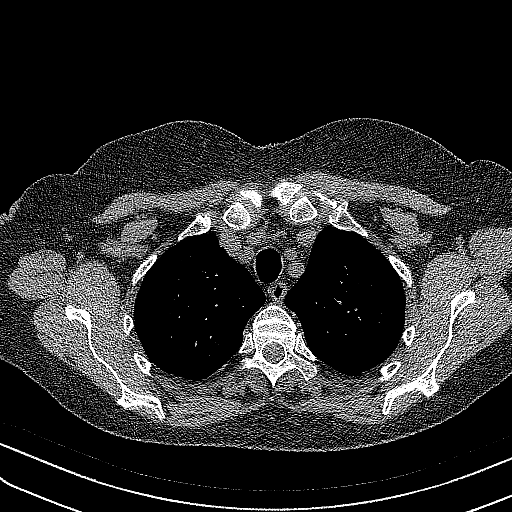
[im 261/319  lung]
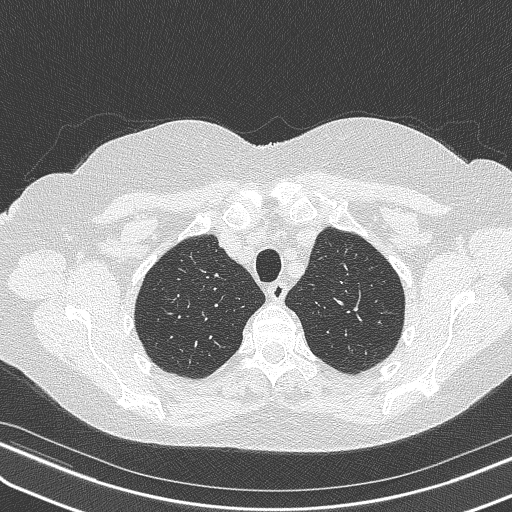
[im 275/319  lung]
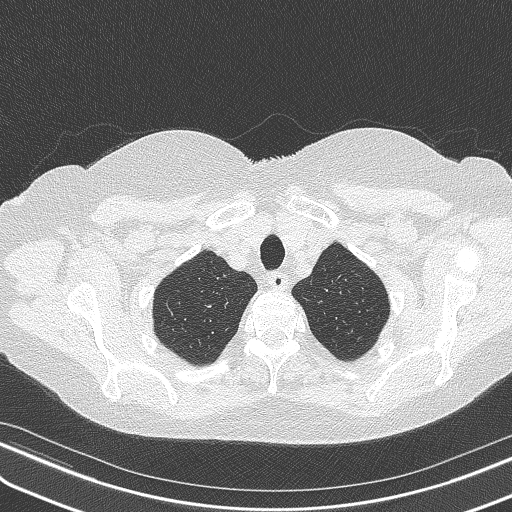
[im 304/319  lung]
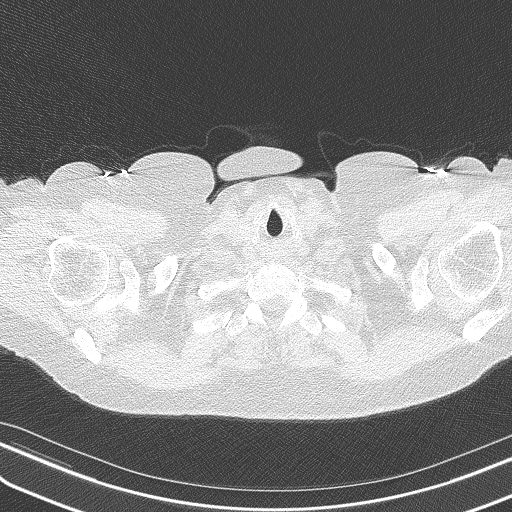

[15 of 32 positions shown; findings below may reference images not displayed]

FINDINGS: Cardiovascular: Heart size appears within normal limits. Mild aortic
atherosclerosis and coronary artery calcifications. No pericardial
effusion.

Mediastinum/Nodes: No enlarged mediastinal, hilar, or axillary lymph
nodes. Thyroid gland, trachea, and esophagus demonstrate no
significant findings.

Lungs/Pleura: Mild changes of emphysema. No pleural effusion,
airspace consolidation, or atelectasis. Several small punctate lung
nodules are again noted and appear unchanged. The largest measures
2.1 mm and is in the posteromedial right apex.

Upper Abdomen: No acute abnormality. Unchanged low-density structure
in lateral segment of left lobe of liver measuring 8 mm. Aortic
atherosclerosis.

Musculoskeletal: No chest wall mass or suspicious bone lesions
identified.
IMPRESSION: 1. Lung-RADS 2, benign appearance or behavior. Continue annual
screening with low-dose chest CT without contrast in 12 months.
2. Emphysema and aortic atherosclerosis.
3. Coronary artery calcifications.

Aortic Atherosclerosis (Z8LBB-VA0.0) and Emphysema (Z8LBB-R7E.M).

## 2022-09-01 ENCOUNTER — Other Ambulatory Visit: Payer: Self-pay | Admitting: Cardiovascular Disease

## 2022-09-01 NOTE — Telephone Encounter (Signed)
Rx request sent to pharmacy.  

## 2022-10-19 ENCOUNTER — Other Ambulatory Visit: Payer: Self-pay | Admitting: Cardiovascular Disease

## 2022-10-19 NOTE — Telephone Encounter (Signed)
Rx(s) sent to pharmacy electronically.  

## 2022-11-09 ENCOUNTER — Ambulatory Visit: Payer: Medicare PPO | Admitting: Dermatology

## 2022-11-12 ENCOUNTER — Other Ambulatory Visit: Payer: Self-pay | Admitting: Cardiovascular Disease

## 2022-11-14 NOTE — Telephone Encounter (Signed)
Rx request sent to pharmacy.  

## 2022-12-23 LAB — LAB REPORT - SCANNED
A1c: 5.7
EGFR: 60

## 2023-02-10 ENCOUNTER — Other Ambulatory Visit: Payer: Self-pay | Admitting: Cardiovascular Disease

## 2023-03-12 ENCOUNTER — Other Ambulatory Visit: Payer: Self-pay | Admitting: Cardiovascular Disease

## 2023-03-14 NOTE — Telephone Encounter (Signed)
Please call pt to schedule overdue 1 year follow-up appointment with Dr. Lynn or APP for refills. Thank you! 

## 2023-04-06 ENCOUNTER — Encounter (HOSPITAL_BASED_OUTPATIENT_CLINIC_OR_DEPARTMENT_OTHER): Payer: Self-pay | Admitting: Cardiovascular Disease

## 2023-04-06 ENCOUNTER — Ambulatory Visit (HOSPITAL_BASED_OUTPATIENT_CLINIC_OR_DEPARTMENT_OTHER): Payer: Medicare PPO | Admitting: Cardiovascular Disease

## 2023-04-06 VITALS — BP 122/70 | HR 64 | Ht 63.0 in | Wt 180.2 lb

## 2023-04-06 DIAGNOSIS — E785 Hyperlipidemia, unspecified: Secondary | ICD-10-CM

## 2023-04-06 DIAGNOSIS — I6523 Occlusion and stenosis of bilateral carotid arteries: Secondary | ICD-10-CM | POA: Diagnosis not present

## 2023-04-06 DIAGNOSIS — I10 Essential (primary) hypertension: Secondary | ICD-10-CM | POA: Diagnosis not present

## 2023-04-06 DIAGNOSIS — I2510 Atherosclerotic heart disease of native coronary artery without angina pectoris: Secondary | ICD-10-CM

## 2023-04-06 MED ORDER — ROSUVASTATIN CALCIUM 40 MG PO TABS: 40.0000 mg | ORAL_TABLET | Freq: Every day | ORAL | 3 refills | Status: DC

## 2023-04-06 NOTE — Patient Instructions (Signed)
Medication Instructions:  Your physician recommends that you continue on your current medications as directed. Please refer to the Current Medication list given to you today.  *If you need a refill on your cardiac medications before your next appointment, please call your pharmacy*   Lab Work: Fasting Labs in February one week before follow up   Follow-Up: At [facility], you and your health needs are our priority.  As part of our continuing mission to provide you with exceptional heart care, we have created designated Provider Care Teams.  These Care Teams include your primary Cardiologist (physician) and Advanced Practice Providers (APPs -  Physician Assistants and Nurse Practitioners) who all work together to provide you with the care you need, when you need it.  We recommend signing up for the patient portal called "MyChart".  Sign up information is provided on this After Visit Summary.  MyChart is used to connect with patients for Virtual Visits (Telemedicine).  Patients are able to view lab/test results, encounter notes, upcoming appointments, etc.  Non-urgent messages can be sent to your provider as well.   To learn more about what you can do with MyChart, go to [email or web address].    Your next appointment:   Follow up in February with Dr. Duke Salvia

## 2023-04-06 NOTE — Progress Notes (Deleted)
Cardiology Office Note:  .   Date:  04/06/2023  ID:  Candice Saunders, DOB [birth year], MRN [id number] PCP: Dois Davenport, MD  [facility] Providers Cardiologist:  None { Click to update primary MD,subspecialty MD or APP then REFRESH:1}   History of Present Illness: .   Candice Saunders is a 72 y.o. female with hypertension, hyperlipidemia, mild carotid stenois, asymptomatic coronary calcification, COPD, and prior tobacco abuse here for follow up.  She was initially seen 06/2018 for the evaluation of atherosclerosis.  Candice Saunders had a chest CT for lung cancer screening on 05/01/2018.  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  She also had pulmonary nodules, emphysema and COPD.  She was referred to cardiology for further evaluation.  Candice Saunders was feeling well but did have some shortness of breath when walking up hills.  Given these findings she was referred for an ETT that was negative for ischemia.  She achieved 7 METS on a Bruce protocol.  She was started on aspirin and rosuvastatin.  Since then she has felt generally well.  However she did have an episode of transient aphasia.  She saw Dr. Hal Hope and was referred for carotid Dopplers that revealed mild stenosis bilaterally.  Echocardiogram 07/2019 revealed LVEF 60 to 65% with mild LVH and aortic sclerosis without stenosis.  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  She is scheduled to see a neurologist soon.  She has not been able to exercise much lately due to plantar fasciitis.  She denies exertional chest pain or shortness of breath.  She has noticed that on her labs her sodium has been running in the 130s for the last year or two.   Hydrochlorothiazide was switched to amlodipine due to hypokalemia.  She followed up with our pharmacist and was doing well.    ROS: ***  Studies Reviewed: .        *** Risk Assessment/Calculations:   {Does this patient have ATRIAL  FIBRILLATION?:[phone]} No BP recorded.  {Refresh Note OR Click here to enter BP  :1}***       Physical Exam:   VS:  There were no vitals taken for this visit.   Wt Readings from Last 3 Encounters:  10/15/21 178 lb (80.7 kg)  08/28/20 176 lb 6.4 oz (80 kg)  10/17/19 172 lb (78 kg)    GEN: Well nourished, well developed in no acute distress NECK: No JVD; No carotid bruits CARDIAC: ***RRR, no murmurs, rubs, gallops RESPIRATORY:  Clear to auscultation without rales, wheezing or rhonchi  ABDOMEN: Soft, non-tender, non-distended EXTREMITIES:  No edema; No deformity   ASSESSMENT AND PLAN: .   ***    {Are you ordering a CV Procedure (e.g. stress test, cath, DCCV, TEE, etc)?   Press F2        :[id number]}  Dispo: ***  Signed, Chilton Si, MD

## 2023-04-06 NOTE — Progress Notes (Signed)
Cardiology Office Note:  .    Date:  04/06/2023  ID:  Candice Saunders, DOB 03/18/1951, MRN [id number] PCP: Dois Davenport, MD  [facility] Providers Cardiologist:  None     History of Present Illness: .    Candice Saunders is a 72 y.o. female with hypertension, hyperlipidemia, mild carotid stenois, asymptomatic coronary calcification, COPD, and prior tobacco abuse here for follow up.  She was initially seen 06/2018 for the evaluation of atherosclerosis.  Candice Saunders had a chest CT for lung cancer screening on 05/01/2018.  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  She also had pulmonary nodules, emphysema and COPD.  She was referred to cardiology for further evaluation.  Candice Saunders was feeling well but did have some shortness of breath when walking up hills.  Given these findings she was referred for an ETT that was negative for ischemia.  She achieved 7 METS on a Bruce protocol.  She was started on aspirin and rosuvastatin.  Since then she has felt generally well.  However she did have an episode of transient aphasia.  She saw Dr. Hal Hope and was referred for carotid Dopplers that revealed mild stenosis bilaterally.  Echocardiogram 07/2019 revealed LVEF 60 to 65% with mild LVH and aortic sclerosis without stenosis.  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  She is scheduled to see a neurologist soon.  She has not been able to exercise much lately due to plantar fasciitis.  She denies exertional chest pain or shortness of breath.  She has noticed that on her labs her sodium has been running in the 130s for the last year or two.   Hydrochlorothiazide was switched to amlodipine due to hypokalemia.  She followed up with our pharmacist and was doing well.    Today, her main complaint is shortness of breath when walking up inclines. She notes that she could walk forever on a level surface. Her breathing has been stable when compared to how she felt at  this time last year. If she needs to she will stop and rest for a minute, and then continue with her walk. For exercise she is also going to the gym twice a week for strength training. She has been feeling stronger as a result. Not much aerobic exercise: she walks occasionally but not on a regular basis. In the office her blood pressure is 122/70. She notes it was similarly well controlled at her last PCP visit. She doesn't monitor her home BP readings. EKG performed today shows Sinus rhythm at 62 bpm, IVCD. Reviewed her lab work from June. She has been on 40 mg rosuvastatin for a couple years. Her LDL has consistently been in the 70's, most recently in the 80's. She tries to follow a good diet. Doesn't consume much red meats, avoids fried foods. Discussed cutting back on cheese products. She denies any palpitations, chest pain, peripheral edema, lightheadedness, headaches, syncope, orthopnea, or PND.  ROS:  Please see the history of present illness. All other systems are reviewed and negative.  (+) Exertional shortness of breath  Studies Reviewed: Marland Kitchen   EKG Interpretation Date/Time:  Thursday April 06 2023 16:29:12 EDT Ventricular Rate:  62 PR Interval:  170 QRS Duration:  122 QT Interval:  450 QTC Calculation: 456 R Axis:   95  Text Interpretation: Normal sinus rhythm Rightward axis Non-specific intra-ventricular conduction delay No previous ECGs available Confirmed by Chilton Si ([id number]) on 04/06/2023 4:55:20 PM  Risk Assessment/Calculations:             Physical Exam:    VS:  BP 122/70   Pulse 64   Ht 5\' 3"  (1.6 m)   Wt 180 lb 3.2 oz (81.7 kg)   SpO2 96%   BMI 31.92 kg/m  , BMI Body mass index is 31.92 kg/m. GENERAL:  Well appearing HEENT: Pupils equal round and reactive, fundi not visualized, oral mucosa unremarkable NECK:  No jugular venous distention, waveform within normal limits, carotid upstroke brisk and symmetric, no bruits, no thyromegaly LUNGS:  Clear to  auscultation bilaterally HEART:  RRR.  PMI not displaced or sustained,S1 and S2 within normal limits, no S3, no S4, no clicks, no rubs, no murmurs ABD:  Flat, positive bowel sounds normal in frequency in pitch, no bruits, no rebound, no guarding, no midline pulsatile mass, no hepatomegaly, no splenomegaly EXT:  2 plus pulses throughout, no edema, no cyanosis no clubbing SKIN:  No rashes no nodules NEURO:  Cranial nerves II through XII grossly intact, motor grossly intact throughout PSYCH:  Cognitively intact, oriented to person place and time  Wt Readings from Last 3 Encounters:  04/06/23 180 lb 3.2 oz (81.7 kg)  10/15/21 178 lb (80.7 kg)  08/28/20 176 lb 6.4 oz (80 kg)     ASSESSMENT AND PLAN: .    # Dyspnea on Exertion Stable shortness of breath with incline, no chest pain. No change in symptoms over the past year. Currently doing strength training twice a week, but limited aerobic activity. -Increase aerobic activity, specifically incline on treadmill, to improve cardiovascular fitness and potentially improve symptoms. -Plan to reassess symptoms after increased aerobic activity.   # Non-obstructive CAD:  # Hyperlipidemia # Mild carotid stenosis:  LDL slightly above goal despite maximum dose of Rosuvastatin. Discussed diet and exercise as potential means to further lower LDL. -Continue Rosuvastatin 40mg  daily. -Encourage reduction in cheese intake and increase in aerobic exercise. -Repeat lipid panel in February 2025 to assess response to lifestyle modifications. -Consider addition of secondary lipid-lowering medication if LDL remains above goal at next check.     # Hypertension:  BP well-controlled on nebivolol and amlodipine.     Dispo:  FU with Roselinda Bahena C. Duke Salvia, MD, [facility] in 6 months.  I,Mathew Stumpf,acting as a Neurosurgeon for Chilton Si, MD.,have documented all relevant documentation on the behalf of Chilton Si, MD,as directed by  Chilton Si, MD while in the  presence of Chilton Si, MD.  I, Shavaughn Seidl C. Duke Salvia, MD have reviewed all documentation for this visit.  The documentation of the exam, diagnosis, procedures, and orders on 04/06/2023 are all accurate and complete.   Signed, Chilton Si, MD

## 2023-05-09 ENCOUNTER — Other Ambulatory Visit: Payer: Self-pay | Admitting: Cardiovascular Disease

## 2023-05-11 ENCOUNTER — Other Ambulatory Visit: Payer: Self-pay | Admitting: Cardiovascular Disease

## 2023-08-03 LAB — LIPID PANEL
Chol/HDL Ratio: 2.2 {ratio} (ref 0.0–4.4)
Cholesterol, Total: 164 mg/dL (ref 100–199)
HDL: 73 mg/dL (ref >39)
LDL Chol Calc (NIH): 74 mg/dL (ref 0–99)
Triglycerides: 94 mg/dL (ref 0–149)
VLDL Cholesterol Cal: 17 mg/dL (ref 5–40)

## 2023-08-03 LAB — COMPREHENSIVE METABOLIC PANEL
ALT: 13 [IU]/L (ref 0–32)
AST: 18 [IU]/L (ref 0–40)
Albumin: 4.4 g/dL (ref 3.8–4.8)
Alkaline Phosphatase: 94 [IU]/L (ref 44–121)
BUN/Creatinine Ratio: 15 (ref 12–28)
BUN: 12 mg/dL (ref 8–27)
Bilirubin Total: 0.4 mg/dL (ref 0.0–1.2)
CO2: 25 mmol/L (ref 20–29)
Calcium: 9.4 mg/dL (ref 8.7–10.3)
Chloride: 95 mmol/L — ABNORMAL LOW (ref 96–106)
Creatinine, Ser: 0.82 mg/dL (ref 0.57–1.00)
Globulin, Total: 2.6 g/dL (ref 1.5–4.5)
Glucose: 90 mg/dL (ref 70–99)
Potassium: 4.9 mmol/L (ref 3.5–5.2)
Sodium: 133 mmol/L — ABNORMAL LOW (ref 134–144)
Total Protein: 7 g/dL (ref 6.0–8.5)
eGFR: 75 mL/min/{1.73_m2} (ref >59)

## 2023-08-04 ENCOUNTER — Encounter (HOSPITAL_BASED_OUTPATIENT_CLINIC_OR_DEPARTMENT_OTHER): Payer: Self-pay

## 2023-08-07 ENCOUNTER — Encounter (HOSPITAL_BASED_OUTPATIENT_CLINIC_OR_DEPARTMENT_OTHER): Payer: Self-pay | Admitting: Cardiovascular Disease

## 2023-08-09 ENCOUNTER — Ambulatory Visit (HOSPITAL_BASED_OUTPATIENT_CLINIC_OR_DEPARTMENT_OTHER): Payer: Medicare PPO | Admitting: Cardiovascular Disease

## 2023-08-09 ENCOUNTER — Encounter (HOSPITAL_BASED_OUTPATIENT_CLINIC_OR_DEPARTMENT_OTHER): Payer: Self-pay | Admitting: Cardiovascular Disease

## 2023-08-09 VITALS — BP 132/76 | Ht 63.0 in | Wt 177.6 lb

## 2023-08-09 DIAGNOSIS — I10 Essential (primary) hypertension: Secondary | ICD-10-CM | POA: Diagnosis not present

## 2023-08-09 DIAGNOSIS — I6523 Occlusion and stenosis of bilateral carotid arteries: Secondary | ICD-10-CM | POA: Diagnosis not present

## 2023-08-09 DIAGNOSIS — I2510 Atherosclerotic heart disease of native coronary artery without angina pectoris: Secondary | ICD-10-CM

## 2023-08-09 MED ORDER — NEBIVOLOL HCL 2.5 MG PO TABS: 2.5000 mg | ORAL_TABLET | Freq: Every day | ORAL | 3 refills | Status: DC

## 2023-08-09 NOTE — Patient Instructions (Signed)
Medication Instructions:  Your physician recommends that you continue on your current medications as directed. Please refer to the Current Medication list given to you today.   *If you need a refill on your cardiac medications before your next appointment, please call your pharmacy*  Lab Work: NONE  Testing/Procedures: NONE  Follow-Up: At [facility], you and your health needs are our priority.  As part of our continuing mission to provide you with exceptional heart care, we have created designated Provider Care Teams.  These Care Teams include your primary Cardiologist (physician) and Advanced Practice Providers (APPs -  Physician Assistants and Nurse Practitioners) who all work together to provide you with the care you need, when you need it.  We recommend signing up for the patient portal called "MyChart".  Sign up information is provided on this After Visit Summary.  MyChart is used to connect with patients for Virtual Visits (Telemedicine).  Patients are able to view lab/test results, encounter notes, upcoming appointments, etc.  Non-urgent messages can be sent to your provider as well.   To learn more about what you can do with MyChart, go to [email or web address].    Your next appointment:   12 month(s)  Provider:   Chilton Si, MD    Other Instructions

## 2023-08-09 NOTE — Progress Notes (Signed)
 Cardiology Office Note:  .   Date:  08/27/2023  ID:  Candice Saunders, DOB October 13, 1950, MRN [id number] PCP: Dois Davenport, MD  [facility] Providers Cardiologist:  None    History of Present Illness: .   Candice Saunders is a 73 y.o. female with hypertension, hyperlipidemia, mild carotid stenois, asymptomatic coronary calcification, COPD, and prior tobacco abuse here for follow up.  She was initially seen 06/2018 for the evaluation of atherosclerosis.  Candice Saunders had a chest CT for lung cancer screening on 05/01/2018.  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  She also had pulmonary nodules, emphysema and COPD.  She was referred to cardiology for further evaluation.  Candice Saunders was feeling well but did have some shortness of breath when walking up hills.  Given these findings she was referred for an ETT that was negative for ischemia.  She achieved 7 METS on a Bruce protocol.  She was started on aspirin and rosuvastatin.  Since then she has felt generally well.  However she did have an episode of transient aphasia.  She saw Dr. Hal Hope and was referred for carotid Dopplers that revealed mild stenosis bilaterally.  Echocardiogram 07/2019 revealed LVEF 60 to 65% with mild LVH and aortic sclerosis without stenosis.  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  She is scheduled to see a neurologist soon.  She has not been able to exercise much lately due to plantar fasciitis.  She denies exertional chest pain or shortness of breath.  She has noticed that on her labs her sodium has been running in the 130s for the last year or two.   Hydrochlorothiazide was switched to amlodipine due to hypokalemia.  She followed up with our pharmacist and was doing well.  At her appointment 03/2023 she reported shortness of breath walking up inclines. Recommended increasing her exercise and increased rosuvastatin.    Candice Saunders experienced a recent episode of lightheadedness  and tunnel vision, which she attributes to dehydration after fasting for a wellness checkup. Pain during a blood draw was identified as a trigger for these symptoms.  She is currently on amlodipine and nebivolol for blood pressure management, and rosuvastatin for cholesterol control. Her LDL cholesterol remains stable at 73-74 mg/dL, slightly above the target of under 70 mg/dL. She has been mindful of her diet, avoiding fried and fatty foods, and engaging in regular exercise.  She reports improved breathing and increased physical activity, noting intentional exercise and feeling stronger and more motivated. She has been walking in the neighborhood more often, contributing to her improved condition.  She has a history of smoking but has quit and is no longer in a smoking cessation program. A CT scan is scheduled as a former smoker, ordered by another physician for screening purposes.      ROS:  As per HPI  Studies Reviewed: .        Risk Assessment/Calculations:         Physical Exam:   VS:  BP 132/76 (Cuff Size: Normal)   Ht 5\' 3"  (1.6 m)   Wt 177 lb 9.6 oz (80.6 kg)   SpO2 97%   BMI 31.46 kg/m  , BMI Body mass index is 31.46 kg/m. GENERAL:  Well appearing HEENT: Pupils equal round and reactive, fundi not visualized, oral mucosa unremarkable NECK:  No jugular venous distention, waveform within normal limits, carotid upstroke brisk and symmetric, no bruits, no thyromegaly LUNGS:  Clear to  auscultation bilaterally HEART:  RRR.  PMI not displaced or sustained,S1 and S2 within normal limits, no S3, no S4, no clicks, no rubs, no murmurs ABD:  Flat, positive bowel sounds normal in frequency in pitch, no bruits, no rebound, no guarding, no midline pulsatile mass, no hepatomegaly, no splenomegaly EXT:  2 plus pulses throughout, no edema, no cyanosis no clubbing SKIN:  No rashes no nodules NEURO:  Cranial nerves II through XII grossly intact, motor grossly intact throughout PSYCH:   Cognitively intact, oriented to person place and time   ASSESSMENT AND PLAN: .    # Pre-syncope: Recent episode of possible dehydration leading to presyncope during blood draw. Patient has increased fluid intake since the event. -Continue increased fluid intake.  # Dyspnea on Exertion Improvement in symptoms with increased exercise. -Continue current exercise regimen.  # Hyperlipidemia LDL slightly above goal at 73. Patient is on maximum dose of Rosuvastatin. -Continue Rosuvastatin. -Encourage continued attention to diet and exercise.  # Hypertension Blood pressure slightly elevated today. Patient is on Amlodipine and Bystolic. -Recheck blood pressure. -Continue current medications.  General Health Maintenance / Followup Plans -CT scan for lung cancer screening scheduled. -Follow up in 1 year, or sooner if needed. -Ensure Bystolic prescription is for 90-day supply.      Dispo: f/u 1 year  Signed, Chilton Si, MD

## 2023-08-27 ENCOUNTER — Encounter (HOSPITAL_BASED_OUTPATIENT_CLINIC_OR_DEPARTMENT_OTHER): Payer: Self-pay | Admitting: Cardiovascular Disease

## 2023-08-28 ENCOUNTER — Ambulatory Visit (HOSPITAL_BASED_OUTPATIENT_CLINIC_OR_DEPARTMENT_OTHER): Payer: Medicare PPO | Admitting: Cardiovascular Disease

## 2024-01-24 ENCOUNTER — Other Ambulatory Visit (HOSPITAL_BASED_OUTPATIENT_CLINIC_OR_DEPARTMENT_OTHER): Payer: Self-pay | Admitting: Cardiovascular Disease

## 2024-06-20 ENCOUNTER — Encounter (HOSPITAL_BASED_OUTPATIENT_CLINIC_OR_DEPARTMENT_OTHER): Payer: Self-pay | Admitting: Cardiovascular Disease

## 2024-07-24 ENCOUNTER — Other Ambulatory Visit (HOSPITAL_BASED_OUTPATIENT_CLINIC_OR_DEPARTMENT_OTHER): Payer: Self-pay | Admitting: Cardiovascular Disease

## 2024-07-30 ENCOUNTER — Ambulatory Visit
Admission: RE | Admit: 2024-07-30 | Discharge: 2024-07-30 | Disposition: A | Source: Ambulatory Visit | Attending: Family Medicine | Admitting: Family Medicine

## 2024-07-30 ENCOUNTER — Other Ambulatory Visit: Payer: Self-pay | Admitting: Family Medicine

## 2024-07-30 ENCOUNTER — Other Ambulatory Visit: Payer: Self-pay | Admitting: Cardiovascular Disease

## 2024-07-30 DIAGNOSIS — G549 Nerve root and plexus disorder, unspecified: Secondary | ICD-10-CM

## 2024-07-30 DIAGNOSIS — M25819 Other specified joint disorders, unspecified shoulder: Secondary | ICD-10-CM

## 2024-08-09 ENCOUNTER — Ambulatory Visit (HOSPITAL_BASED_OUTPATIENT_CLINIC_OR_DEPARTMENT_OTHER): Admitting: Family

## 2024-08-09 ENCOUNTER — Encounter (HOSPITAL_BASED_OUTPATIENT_CLINIC_OR_DEPARTMENT_OTHER): Payer: Self-pay | Admitting: Family

## 2024-08-09 VITALS — BP 128/84 | HR 68 | Ht 63.0 in | Wt 178.5 lb

## 2024-08-09 DIAGNOSIS — I2510 Atherosclerotic heart disease of native coronary artery without angina pectoris: Secondary | ICD-10-CM | POA: Diagnosis not present

## 2024-08-09 DIAGNOSIS — I10 Essential (primary) hypertension: Secondary | ICD-10-CM | POA: Diagnosis not present

## 2024-08-09 DIAGNOSIS — E785 Hyperlipidemia, unspecified: Secondary | ICD-10-CM | POA: Diagnosis not present

## 2024-08-09 MED ORDER — NEBIVOLOL HCL 2.5 MG PO TABS: 2.5000 mg | ORAL_TABLET | Freq: Every day | ORAL | 3 refills | Status: AC

## 2024-08-09 MED ORDER — ROSUVASTATIN CALCIUM 40 MG PO TABS: 40.0000 mg | ORAL_TABLET | Freq: Every day | ORAL | 3 refills | Status: AC

## 2024-08-09 NOTE — Patient Instructions (Signed)
 Medication Instructions:  Continue your current medications.   *If you need a refill on your cardiac medications before your next appointment, please call your pharmacy*  Testing/Procedures: Your EKG looked great!  Follow-Up: At [facility], you and your health needs are our priority.  As part of our continuing mission to provide you with exceptional heart care, our providers are all part of one team.  This team includes your primary Cardiologist (physician) and Advanced Practice Providers or APPs (Physician Assistants and Nurse Practitioners) who all work together to provide you with the care you need, when you need it.  Your next appointment:   1 year(s)  Provider:   Annabella Scarce, MD, Rosaline Bane, NP, or Reche Finder, NP    We recommend signing up for the patient portal called MyChart.  Sign up information is provided on this After Visit Summary.  MyChart is used to connect with patients for Virtual Visits (Telemedicine).  Patients are able to view lab/test results, encounter notes, upcoming appointments, etc.  Non-urgent messages can be sent to your provider as well.   To learn more about what you can do with MyChart, go to [email or web address].   Other Instructions
